# Patient Record
Sex: Male | Born: 1965 | Race: White | Hispanic: No | Marital: Married | Smoking: Never smoker
Health system: Southern US, Community
[De-identification: ages and names within clinical notes are randomized; demographics above are authoritative.]

## PROBLEM LIST (undated history)

## (undated) DIAGNOSIS — M419 Scoliosis, unspecified: Secondary | ICD-10-CM

## (undated) DIAGNOSIS — G4700 Insomnia, unspecified: Secondary | ICD-10-CM

## (undated) DIAGNOSIS — I10 Essential (primary) hypertension: Secondary | ICD-10-CM

## (undated) DIAGNOSIS — I639 Cerebral infarction, unspecified: Secondary | ICD-10-CM

## (undated) DIAGNOSIS — J342 Deviated nasal septum: Secondary | ICD-10-CM

## (undated) DIAGNOSIS — J209 Acute bronchitis, unspecified: Secondary | ICD-10-CM

## (undated) DIAGNOSIS — E119 Type 2 diabetes mellitus without complications: Secondary | ICD-10-CM

## (undated) DIAGNOSIS — Z87442 Personal history of urinary calculi: Secondary | ICD-10-CM

## (undated) DIAGNOSIS — J302 Other seasonal allergic rhinitis: Secondary | ICD-10-CM

## (undated) DIAGNOSIS — N200 Calculus of kidney: Secondary | ICD-10-CM

## (undated) DIAGNOSIS — F419 Anxiety disorder, unspecified: Secondary | ICD-10-CM

## (undated) DIAGNOSIS — J343 Hypertrophy of nasal turbinates: Secondary | ICD-10-CM

## (undated) DIAGNOSIS — N400 Enlarged prostate without lower urinary tract symptoms: Secondary | ICD-10-CM

## (undated) DIAGNOSIS — E785 Hyperlipidemia, unspecified: Secondary | ICD-10-CM

---
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
## 1985-08-28 HISTORY — PX: KNEE SURGERY: SHX244

## 2007-04-11 ENCOUNTER — Ambulatory Visit: Payer: Self-pay | Admitting: Internal Medicine

## 2008-03-17 ENCOUNTER — Ambulatory Visit: Payer: Self-pay | Admitting: Internal Medicine

## 2008-07-01 ENCOUNTER — Ambulatory Visit: Payer: Self-pay | Admitting: Family Medicine

## 2008-07-10 ENCOUNTER — Ambulatory Visit: Payer: Self-pay | Admitting: Internal Medicine

## 2009-09-20 ENCOUNTER — Ambulatory Visit: Payer: Self-pay | Admitting: Internal Medicine

## 2010-06-27 ENCOUNTER — Emergency Department: Payer: Self-pay | Admitting: Emergency Medicine

## 2010-07-11 ENCOUNTER — Ambulatory Visit: Payer: Self-pay | Admitting: Urology

## 2013-10-11 ENCOUNTER — Ambulatory Visit: Payer: Self-pay | Admitting: Emergency Medicine

## 2014-05-06 ENCOUNTER — Emergency Department: Payer: Self-pay | Admitting: Emergency Medicine

## 2015-08-08 ENCOUNTER — Ambulatory Visit
Admission: EM | Admit: 2015-08-08 | Discharge: 2015-08-08 | Disposition: A | Discharge status: Home / Self Care | Payer: BC Managed Care – PPO | Attending: Family Medicine | Admitting: Family Medicine

## 2015-08-08 ENCOUNTER — Encounter: Payer: Self-pay | Admitting: Gynecology

## 2015-08-08 DIAGNOSIS — J209 Acute bronchitis, unspecified: Secondary | ICD-10-CM

## 2015-08-08 DIAGNOSIS — R05 Cough: Secondary | ICD-10-CM

## 2015-08-08 DIAGNOSIS — J40 Bronchitis, not specified as acute or chronic: Secondary | ICD-10-CM | POA: Diagnosis not present

## 2015-08-08 DIAGNOSIS — R059 Cough, unspecified: Secondary | ICD-10-CM

## 2015-08-08 HISTORY — DX: Type 2 diabetes mellitus without complications: E11.9

## 2015-08-08 HISTORY — DX: Scoliosis, unspecified: M41.9

## 2015-08-08 HISTORY — DX: Acute bronchitis, unspecified: J20.9

## 2015-08-08 HISTORY — DX: Calculus of kidney: N20.0

## 2015-08-08 HISTORY — DX: Essential (primary) hypertension: I10

## 2015-08-08 HISTORY — DX: Hyperlipidemia, unspecified: E78.5

## 2015-08-08 HISTORY — DX: Anxiety disorder, unspecified: F41.9

## 2015-08-08 HISTORY — DX: Insomnia, unspecified: G47.00

## 2015-08-08 MED ORDER — FEXOFENADINE-PSEUDOEPHED ER 180-240 MG PO TB24: 1.0000 | ORAL_TABLET | Freq: Every day | ORAL | Status: DC

## 2015-08-08 MED ORDER — HYDROCOD POLST-CPM POLST ER 10-8 MG/5ML PO SUER: 5.0000 mL | Freq: Two times a day (BID) | ORAL | Status: DC | PRN

## 2015-08-08 MED ORDER — AZITHROMYCIN 500 MG PO TABS: ORAL_TABLET | ORAL | Status: DC

## 2015-08-08 NOTE — ED Provider Notes (Signed)
CSN: [id number]     Arrival date & time 08/08/15  0807 History   First MD Initiated Contact with Patient 08/08/15 [phone]    Nurses notes were reviewed. Chief Complaint  Patient presents with  . Cough   patient is here because of cough for about 3 weeks or just about Thanksgiving time. States that he had bronchopneumonia about 2 years ago started off just like this. He has been coughing and coughing up some grayish thick material at times. Denies having any wheezing as she states when he goes cold weather the help with cough but the minute he gets inside the house. Coughing. States she's using humidifier but is has not helped. She saw his doctor last week early for clearance for upcoming surgery and mentioned it to him. He was put on decongestant that has not helped. Try to get him see his doctor Thursday was unable to get an appointment and was unable to come to urgent care on Friday or Saturday. He is here today because of the persistent coughing and keeping him up at night     (Consider location/radiation/quality/duration/timing/severity/associated sxs/prior Treatment) Patient is a 49 y.o. male presenting with cough. The history is provided by the patient. No language interpreter was used.  Cough Cough characteristics:  Productive and non-productive Sputum characteristics:  Brown and yellow Severity:  Moderate Timing:  Constant Progression:  Worsening Context: upper respiratory infection and with activity   Context: not animal exposure, not exposure to allergens, not fumes, not occupational exposure, not sick contacts, not smoke exposure and not weather changes   Relieved by:  Nothing Ineffective treatments:  Steam, rest and cough suppressants Associated symptoms: rhinorrhea and sinus congestion   Associated symptoms: no chest pain, no chills, no diaphoresis, no ear fullness, no ear pain, no fever, no myalgias, no shortness of breath, no sore throat, no weight loss and no wheezing     Past  Medical History  Diagnosis Date  . Diabetes mellitus without complication (HCC)   . Hypertension   . Anxiety   . Scoliosis   . Hyperlipemia   . Insomnia   . Nephrolithiasis    Past Surgical History  Procedure Laterality Date  . Hernia repair    . Knee surgery Right    No family history on file. Social History  Substance Use Topics  . Smoking status: Never Smoker   . Smokeless tobacco: None  . Alcohol Use: Yes    Review of Systems  Constitutional: Negative for fever, chills, weight loss and diaphoresis.  HENT: Positive for rhinorrhea. Negative for ear pain and sore throat.   Respiratory: Positive for cough. Negative for shortness of breath and wheezing.   Cardiovascular: Negative for chest pain.  Musculoskeletal: Negative for myalgias.  All other systems reviewed and are negative.   Allergies  Review of patient's allergies indicates no known allergies.  Home Medications   Prior to Admission medications   Medication Sig Start Date End Date Taking? Authorizing Provider  ALPRAZolam Prudy Feeler(XANAX) 0.5 MG tablet Take 0.5 mg by mouth at bedtime as needed for anxiety.   Yes Historical Provider, MD  aspirin 81 MG tablet Take 81 mg by mouth daily.   Yes Historical Provider, MD  fluticasone (FLONASE) 50 MCG/ACT nasal spray Place into both nostrils daily.   Yes Historical Provider, MD  losartan (COZAAR) 100 MG tablet Take 100 mg by mouth daily.   Yes Historical Provider, MD  Melatonin 1 MG CAPS Take by mouth.   Yes Historical Provider, MD  metFORMIN (GLUCOPHAGE) 500 MG tablet Take by mouth 2 (two) times daily with a meal.   Yes Historical Provider, MD  metoprolol (LOPRESSOR) 50 MG tablet Take 50 mg by mouth 2 (two) times daily.   Yes Historical Provider, MD  montelukast (SINGULAIR) 10 MG tablet Take 10 mg by mouth at bedtime.   Yes Historical Provider, MD  rosuvastatin (CRESTOR) 20 MG tablet Take 20 mg by mouth daily.   Yes Historical Provider, MD  traZODone (DESYREL) 100 MG tablet Take  100 mg by mouth at bedtime.   Yes Historical Provider, MD  azithromycin (ZITHROMAX) 500 MG tablet Take 1 po daily for five days 08/08/15   Hassan Rowan, MD  chlorpheniramine-HYDROcodone [facility]) 10-8 MG/5ML SUER Take 5 mLs by mouth every 12 (twelve) hours as needed for cough. 08/08/15   Hassan Rowan, MD  fexofenadine-pseudoephedrine (ALLEGRA-D ALLERGY & CONGESTION) 180-240 MG 24 hr tablet Take 1 tablet by mouth daily. 08/08/15   Hassan Rowan, MD   Meds Ordered and Administered this Visit  Medications - No data to display  BP 114/73 mmHg  Pulse 76  Temp(Src) 98 F (36.7 C) (Oral)  Resp 18  Ht  (1.803 m)  Wt 235 lb (106.595 kg)  BMI 32.79 kg/m2  SpO2 98% No data found.   Physical Exam  Constitutional: He is oriented to person, place, and time. He appears well-developed and well-nourished.  HENT:  Head: Normocephalic.  Eyes: Conjunctivae are normal. Pupils are equal, round, and reactive to light.  Neck: Normal range of motion. Neck supple. No tracheal deviation present. No thyromegaly present.  Cardiovascular: Normal rate and regular rhythm.   Pulmonary/Chest: Effort normal and breath sounds normal. No respiratory distress.  Musculoskeletal: Normal range of motion. He exhibits no edema.  Neurological: He is alert and oriented to person, place, and time.  Skin: Skin is warm and dry.  Psychiatric: He has a normal mood and affect.  Vitals reviewed.   ED Course  Procedures (including critical care time)  Labs Review Labs Reviewed - No data to display  Imaging Review No results found.   Visual Acuity Review  Right Eye Distance:   Left Eye Distance:   Bilateral Distance:    Right Eye Near:   Left Eye Near:    Bilateral Near:         MDM   1. Bronchitis   2. Cough    Place him on Zithromax 500 mg for the next 5 days, Tussionex 1 teaspoon twice a day, Allegra-D 1 tablet daily. 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  Have him follow-up PCP if not better in 1-2 weeks Dr. Thurmond Butts dictation thank you.     Hassan Rowan, MD 08/08/15 [phone]

## 2015-08-08 NOTE — ED Notes (Signed)
Patient c/o persistence coughing x 3 weeks.

## 2015-08-08 NOTE — Discharge Instructions (Signed)
Cool Mist Vaporizers °Vaporizers may help relieve the symptoms of a cough and cold. They add moisture to the air, which helps mucus to become thinner and less sticky. This makes it easier to breathe and cough up secretions. Cool mist vaporizers do not cause serious burns like hot mist vaporizers, which may also be called steamers or humidifiers. Vaporizers have not been proven to help with colds. You should not use a vaporizer if you are allergic to mold. °HOME CARE INSTRUCTIONS °· Follow the package instructions for the vaporizer. °· Do not use anything other than distilled water in the vaporizer. °· Do not run the vaporizer all of the time. This can cause mold or bacteria to grow in the vaporizer. °· Clean the vaporizer after each time it is used. °· Clean and dry the vaporizer well before storing it. °· Stop using the vaporizer if worsening respiratory symptoms develop. °  °This information is not intended to replace advice given to you by your health care provider. Make sure you discuss any questions you have with your health care provider. °  °Document Released: 05/11/2004 Document Revised: 08/19/2013 Document Reviewed: 01/01/2013 °Elsevier Interactive Patient Education ©2016 [employer] ° °Cough, Adult °A cough helps to clear your throat and lungs. A cough may last only 2-3 weeks (acute), or it may last longer than 8 weeks (chronic). Many different things can cause a cough. A cough may be a sign of an illness or another medical condition. °HOME CARE °· Pay attention to any changes in your cough. °· Take medicines only as told by your doctor. °¨ If you were prescribed an antibiotic medicine, take it as told by your doctor. Do not stop taking it even if you start to feel better. °¨ Talk with your doctor before you try using a cough medicine. °· Drink enough fluid to keep your pee (urine) clear or pale yellow. °· If the air is dry, use a cold steam vaporizer or humidifier in your home. °· Stay away from things  that make you cough at work or at home. °· If your cough is worse at night, try using extra pillows to raise your head up higher while you sleep. °· Do not smoke, and try not to be around smoke. If you need help quitting, ask your doctor. °· Do not have caffeine. °· Do not drink alcohol. °· Rest as needed. °GET HELP IF: °· You have new problems (symptoms). °· You cough up yellow fluid (pus). °· Your cough does not get better after 2-3 weeks, or your cough gets worse. °· Medicine does not help your cough and you are not sleeping well. °· You have pain that gets worse or pain that is not helped with medicine. °· You have a fever. °· You are losing weight and you do not know why. °· You have night sweats. °GET HELP RIGHT AWAY IF: °· You cough up blood. °· You have trouble breathing. °· Your heartbeat is very fast. °  °This information is not intended to replace advice given to you by your health care provider. Make sure you discuss any questions you have with your health care provider. °  °Document Released: 04/27/2011 Document Revised: 05/05/2015 Document Reviewed: 10/21/2014 °Elsevier Interactive Patient Education ©2016 [employer] ° °Upper Respiratory Infection, Adult °Most upper respiratory infections (URIs) are caused by a virus. A URI affects the nose, throat, and upper air passages. The most common type of URI is often called "the common cold." °HOME CARE  °·   Take medicines only as told by your doctor. °· Gargle warm saltwater or take cough drops to comfort your throat as told by your doctor. °· Use a warm mist humidifier or inhale steam from a shower to increase air moisture. This may make it easier to breathe. °· Drink enough fluid to keep your pee (urine) clear or pale yellow. °· Eat soups and other clear broths. °· Have a healthy diet. °· Rest as needed. °· Go back to work when your fever is gone or your doctor says it is okay. °¨ You may need to stay home longer to avoid giving your URI to others. °¨ You  can also wear a face mask and wash your hands often to prevent spread of the virus. °· Use your inhaler more if you have asthma. °· Do not use any tobacco products, including cigarettes, chewing tobacco, or electronic cigarettes. If you need help quitting, ask your doctor. °GET HELP IF: °· You are getting worse, not better. °· Your symptoms are not helped by medicine. °· You have chills. °· You are getting more short of breath. °· You have brown or red mucus. °· You have yellow or brown discharge from your nose. °· You have pain in your face, especially when you bend forward. °· You have a fever. °· You have puffy (swollen) neck glands. °· You have pain while swallowing. °· You have white areas in the back of your throat. °GET HELP RIGHT AWAY IF:  °· You have very bad or constant: °¨ Headache. °¨ Ear pain. °¨ Pain in your forehead, behind your eyes, and over your cheekbones (sinus pain). °¨ Chest pain. °· You have long-lasting (chronic) lung disease and any of the following: °¨ Wheezing. °¨ Long-lasting cough. °¨ Coughing up blood. °¨ A change in your usual mucus. °· You have a stiff neck. °· You have changes in your: °¨ Vision. °¨ Hearing. °¨ Thinking. °¨ Mood. °MAKE SURE YOU:  °· Understand these instructions. °· Will watch your condition. °· Will get help right away if you are not doing well or get worse. °  °This information is not intended to replace advice given to you by your health care provider. Make sure you discuss any questions you have with your health care provider. °  °Document Released: 01/31/2008 Document Revised: 12/29/2014 Document Reviewed: 11/19/2013 °Elsevier Interactive Patient Education ©2016 [employer] Inc. ° °

## 2015-08-18 ENCOUNTER — Encounter: Payer: Self-pay | Admitting: *Deleted

## 2015-08-25 NOTE — Discharge Instructions (Signed)
[facility] °[facility] °ENDOSCOPIC SINUS SURGERY °[facility] ° °What is Functional Endoscopic Sinus Surgery? ° The Surgery involves making the natural openings of the sinuses larger by removing the bony partitions that separate the sinuses from the nasal cavity.  The natural sinus lining is preserved as much as possible to allow the sinuses to resume normal function after the surgery.  In some patients nasal polyps (excessively swollen lining of the sinuses) may be removed to relieve obstruction of the sinus openings.  The surgery is performed through the nose using lighted scopes, which eliminates the need for incisions on the face.  A septoplasty is a different procedure which is sometimes performed with sinus surgery.  It involves straightening the boy partition that separates the two sides of your nose.  A crooked or deviated septum may need repair if is obstructing the sinuses or nasal airflow.  Turbinate reduction is also often performed during sinus surgery.  The turbinates are bony proturberances from the side walls of the nose which swell and can obstruct the nose in patients with sinus and allergy problems.  Their size can be surgically reduced to help relieve nasal obstruction. ° °What Can Sinus Surgery Do For Me? ° Sinus surgery can reduce the frequency of sinus infections requiring antibiotic treatment.  This can provide improvement in nasal congestion, post-nasal drainage, facial pressure and nasal obstruction.  Surgery will NOT prevent you from ever having an infection again, so it usually only for patients who get infections 4 or more times yearly requiring antibiotics, or for infections that do not clear with antibiotics.  It will not cure nasal allergies, so patients with allergies may still require medication to treat their allergies after surgery. Surgery may improve headaches related to sinusitis, however, some people will continue to  require medication to control sinus headaches related to allergies.  Surgery will do nothing for other forms of headache (migraine, tension or cluster). ° °What Are the Risks of Endoscopic Sinus Surgery? ° Current techniques allow surgery to be performed safely with little risk, however, there are rare complications that patients should be aware of.  Because the sinuses are located around the eyes, there is risk of eye injury, including blindness, though again, this would be quite rare. This is usually a result of bleeding behind the eye during surgery, which puts the vision oat risk, though there are treatments to protect the vision and prevent permanent disrupted by surgery causing a leak of the spinal fluid that surrounds the brain.  More serious complications would include bleeding inside the brain cavity or damage to the brain.  Again, all of these complications are uncommon, and spinal fluid leaks can be safely managed surgically if they occur.  The most common complication of sinus surgery is bleeding from the nose, which may require packing or cauterization of the nose.  Continued sinus have polyps may experience recurrence of the polyps requiring revision surgery.  Alterations of sense of smell or injury to the tear ducts are also rare complications.  ° °What is the Surgery Like, and what is the Recovery? ° The Surgery usually takes a couple of hours to perform, and is usually performed under a general anesthetic (completely asleep).  Patients are usually discharged home after a couple of hours.  Sometimes during surgery it is necessary to pack the nose to control bleeding, and the packing is left in place for 24 - 48 hours, and removed by your surgeon.    If a septoplasty was performed during the procedure, there is often a splint placed which must be removed after 5-7 days.   °Discomfort: Pain is usually mild to moderate, and can be controlled by prescription pain medication or acetaminophen (Tylenol).   Aspirin, Ibuprofen (Advil, Motrin), or Naprosyn (Aleve) should be avoided, as they can cause increased bleeding.  Most patients feel sinus pressure like they have a bad head cold for several days.  Sleeping with your head elevated can help reduce swelling and facial pressure, as can ice packs over the face.  A humidifier may be helpful to keep the mucous and blood from drying in the nose.  ° °Diet: There are no specific diet restrictions, however, you should generally start with clear liquids and a light diet of bland foods because the anesthetic can cause some nausea.  Advance your diet depending on how your stomach feels.  Taking your pain medication with food will often help reduce stomach upset which pain medications can cause. ° °Nasal Saline Irrigation: It is important to remove blood clots and dried mucous from the nose as it is healing.  This is done by having you irrigate the nose at least 3 - 4 times daily with a salt water solution.  We recommend using NeilMed Sinus Rinse (available at the drug store).  Fill the squeeze bottle with the solution, bend over a sink, and insert the tip of the squeeze bottle into the nose ½ of an inch.  Point the tip of the squeeze bottle towards the inside corner of the eye on the same side your irrigating.  Squeeze the bottle and gently irrigate the nose.  If you bend forward as you do this, most of the fluid will flow back out of the nose, instead of down your throat.   The solution should be warm, near body temperature, when you irrigate.   Each time you irrigate, you should use a full squeeze bottle.  ° °Note that if you are instructed to use Nasal Steroid Sprays at any time after your surgery, irrigate with saline BEFORE using the steroid spray, so you do not wash it all out of the nose. °Another product, Nasal Saline Gel (such as AYR Nasal Saline Gel) can be applied in each nostril 3 - 4 times daily to moisture the nose and reduce scabbing or crusting. ° °Bleeding:   Bloody drainage from the nose can be expected for several days, and patients are instructed to irrigate their nose frequently with salt water to help remove mucous and blood clots.  The drainage may be dark red or brown, though some fresh blood may be seen intermittently, especially after irrigation.  Do not blow you nose, as bleeding may occur. If you must sneeze, keep your mouth open to allow air to escape through your mouth. ° °If heavy bleeding occurs: Irrigate the nose with saline to rinse out clots, then spray the nose 3 - 4 times with Afrin Nasal Decongestant Spray.  The spray will constrict the blood vessels to slow bleeding.  Pinch the lower half of your nose shut to apply pressure, and lay down with your head elevated.  Ice packs over the nose may help as well. If bleeding persists despite these measures, you should notify your doctor.  Do not use the Afrin routinely to control nasal congestion after surgery, as it can result in worsening congestion and may affect healing.  ° ° ° °Activity: Return to work varies among patients. Most patients will be   out of work at least 5 - 7 days to recover.  Patient may return to work after they are off of narcotic pain medication, and feeling well enough to perform the functions of their job.  Patients must avoid heavy lifting (over 10 pounds) or strenuous physical for 2 weeks after surgery, so your employer may need to assign you to light duty, or keep you out of work longer if light duty is not possible.  NOTE: you should not drive, operate dangerous machinery, do any mentally demanding tasks or make any important legal or financial decisions while on narcotic pain medication and recovering from the general anesthetic.  °  °Call Your Doctor Immediately if You Have Any of the Following: °1. Bleeding that you cannot control with the above measures °2. Loss of vision, double vision, bulging of the eye or black eyes. °3. Fever over 101 degrees °4. Neck stiffness with  severe headache, fever, nausea and change in mental state. °You are always encourage to call anytime with concerns, however, please call with requests for pain medication refills during office hours. ° °Office Endoscopy: During follow-up visits your doctor will remove any packing or splints that may have been placed and evaluate and clean your sinuses endoscopically.  Topical anesthetic will be used to make this as comfortable as possible, though you may want to take your pain medication prior to the visit.  How often this will need to be done varies from patient to patient.  After complete recovery from the surgery, you may need follow-up endoscopy from time to time, particularly if there is concern of recurrent infection or nasal polyps. ° °General Anesthesia, Adult, Care After °Refer to this sheet in the next few weeks. These instructions provide you with information on caring for yourself after your procedure. Your health care provider may also give you more specific instructions. Your treatment has been planned according to current medical practices, but problems sometimes occur. Call your health care provider if you have any problems or questions after your procedure. °WHAT TO EXPECT AFTER THE PROCEDURE °After the procedure, it is typical to experience: °· Sleepiness. °· Nausea and vomiting. °HOME CARE INSTRUCTIONS °· For the first 24 hours after general anesthesia: °¨ Have a responsible person with you. °¨ Do not drive a car. If you are alone, do not take public transportation. °¨ Do not drink alcohol. °¨ Do not take medicine that has not been prescribed by your health care provider. °¨ Do not sign important papers or make important decisions. °¨ You may resume a normal diet and activities as directed by your health care provider. °· Change bandages (dressings) as directed. °· If you have questions or problems that seem related to general anesthesia, call the hospital and ask for the anesthetist or  anesthesiologist on call. °SEEK MEDICAL CARE IF: °· You have nausea and vomiting that continue the day after anesthesia. °· You develop a rash. °SEEK IMMEDIATE MEDICAL CARE IF:  °· You have difficulty breathing. °· You have chest pain. °· You have any allergic problems. °  °This information is not intended to replace advice given to you by your health care provider. Make sure you discuss any questions you have with your health care provider. °  °Document Released: 11/20/2000 Document Revised: 09/04/2014 Document Reviewed: 12/13/2011 °Elsevier Interactive Patient Education ©2016 [employer]vier Inc. ° °

## 2015-08-26 ENCOUNTER — Ambulatory Visit: Payer: BC Managed Care – PPO | Admitting: Student in an Organized Health Care Education/Training Program

## 2015-08-26 ENCOUNTER — Encounter: Payer: Self-pay | Admitting: *Deleted

## 2015-08-26 ENCOUNTER — Ambulatory Visit
Admission: RE | Admit: 2015-08-26 | Discharge: 2015-08-26 | Disposition: A | Discharge status: Home / Self Care | Payer: BC Managed Care – PPO | Source: Ambulatory Visit | Attending: Otolaryngology | Admitting: Otolaryngology

## 2015-08-26 ENCOUNTER — Encounter
Admission: RE | Disposition: A | Discharge status: Home / Self Care | Payer: Self-pay | Source: Ambulatory Visit | Attending: Otolaryngology

## 2015-08-26 DIAGNOSIS — J342 Deviated nasal septum: Secondary | ICD-10-CM | POA: Insufficient documentation

## 2015-08-26 DIAGNOSIS — I10 Essential (primary) hypertension: Secondary | ICD-10-CM | POA: Insufficient documentation

## 2015-08-26 DIAGNOSIS — E119 Type 2 diabetes mellitus without complications: Secondary | ICD-10-CM | POA: Insufficient documentation

## 2015-08-26 DIAGNOSIS — J3489 Other specified disorders of nose and nasal sinuses: Secondary | ICD-10-CM | POA: Diagnosis not present

## 2015-08-26 DIAGNOSIS — J343 Hypertrophy of nasal turbinates: Secondary | ICD-10-CM | POA: Insufficient documentation

## 2015-08-26 DIAGNOSIS — F419 Anxiety disorder, unspecified: Secondary | ICD-10-CM | POA: Insufficient documentation

## 2015-08-26 HISTORY — DX: Acute bronchitis, unspecified: J20.9

## 2015-08-26 HISTORY — PX: SEPTOPLASTY: SHX2393

## 2015-08-26 HISTORY — PX: NASAL TURBINATE REDUCTION: SHX2072

## 2015-08-26 LAB — GLUCOSE, CAPILLARY
GLUCOSE-CAPILLARY: 155 mg/dL — AB (ref 65–99)
Glucose-Capillary: 156 mg/dL — ABNORMAL HIGH (ref 65–99)

## 2015-08-26 SURGERY — SEPTOPLASTY, NOSE
Anesthesia: General | Wound class: Clean Contaminated

## 2015-08-26 MED ORDER — LIDOCAINE-EPINEPHRINE 1 %-1:100000 IJ SOLN
INTRAMUSCULAR | Status: DC | PRN
  Administered 2015-08-26: 7 mL

## 2015-08-26 MED ORDER — PROMETHAZINE HCL 25 MG/ML IJ SOLN: 6.2500 mg | INTRAMUSCULAR | Status: DC | PRN

## 2015-08-26 MED ORDER — MIDAZOLAM HCL 5 MG/5ML IJ SOLN
INTRAMUSCULAR | Status: DC | PRN
  Administered 2015-08-26: 2 mg via INTRAVENOUS

## 2015-08-26 MED ORDER — ACETAMINOPHEN 10 MG/ML IV SOLN
INTRAVENOUS | Status: DC | PRN
  Administered 2015-08-26: 1000 mg via INTRAVENOUS

## 2015-08-26 MED ORDER — SUCCINYLCHOLINE CHLORIDE 20 MG/ML IJ SOLN
INTRAMUSCULAR | Status: DC | PRN
  Administered 2015-08-26: 100 mg via INTRAVENOUS

## 2015-08-26 MED ORDER — PROPOFOL 10 MG/ML IV BOLUS
INTRAVENOUS | Status: DC | PRN
  Administered 2015-08-26: 200 mg via INTRAVENOUS

## 2015-08-26 MED ORDER — OXYMETAZOLINE HCL 0.05 % NA SOLN
2.0000 | Freq: Once | NASAL | Status: AC
  Administered 2015-08-26: 2 via NASAL

## 2015-08-26 MED ORDER — LACTATED RINGERS IV SOLN
INTRAVENOUS | Status: DC
  Administered 2015-08-26: 10:00:00 via INTRAVENOUS

## 2015-08-26 MED ORDER — HYDROMORPHONE HCL 1 MG/ML IJ SOLN: 0.2500 mg | INTRAMUSCULAR | Status: DC | PRN

## 2015-08-26 MED ORDER — PHENYLEPHRINE HCL 0.5 % NA SOLN
NASAL | Status: DC | PRN
  Administered 2015-08-26: 30 mL via TOPICAL

## 2015-08-26 MED ORDER — CEFAZOLIN SODIUM-DEXTROSE 2-3 GM-% IV SOLR
2.0000 g | Freq: Once | INTRAVENOUS | Status: AC
  Administered 2015-08-26: 2 g via INTRAVENOUS

## 2015-08-26 MED ORDER — ROCURONIUM BROMIDE 100 MG/10ML IV SOLN
INTRAVENOUS | Status: DC | PRN
  Administered 2015-08-26: 20 mg via INTRAVENOUS

## 2015-08-26 MED ORDER — MEPERIDINE HCL 25 MG/ML IJ SOLN: 6.2500 mg | INTRAMUSCULAR | Status: DC | PRN

## 2015-08-26 MED ORDER — OXYCODONE HCL 5 MG PO TABS
5.0000 mg | ORAL_TABLET | Freq: Once | ORAL | Status: AC | PRN
  Administered 2015-08-26: 5 mg via ORAL

## 2015-08-26 MED ORDER — ONDANSETRON HCL 4 MG/2ML IJ SOLN
INTRAMUSCULAR | Status: DC | PRN
  Administered 2015-08-26: 4 mg via INTRAVENOUS

## 2015-08-26 MED ORDER — LIDOCAINE HCL (CARDIAC) 20 MG/ML IV SOLN
INTRAVENOUS | Status: DC | PRN
  Administered 2015-08-26: 40 mg via INTRAVENOUS

## 2015-08-26 MED ORDER — FENTANYL CITRATE (PF) 100 MCG/2ML IJ SOLN
INTRAMUSCULAR | Status: DC | PRN
  Administered 2015-08-26: 100 ug via INTRAVENOUS
  Administered 2015-08-26: 25 ug via INTRAVENOUS

## 2015-08-26 MED ORDER — DEXAMETHASONE SODIUM PHOSPHATE 4 MG/ML IJ SOLN
INTRAMUSCULAR | Status: DC | PRN
  Administered 2015-08-26: 10 mg via INTRAVENOUS

## 2015-08-26 MED ORDER — OXYCODONE HCL 5 MG/5ML PO SOLN: 5.0000 mg | Freq: Once | ORAL | Status: AC | PRN

## 2015-08-26 SURGICAL SUPPLY — 43 items
BLADE SURG 15 STRL LF DISP TIS (BLADE) IMPLANT
BLADE SURG 15 STRL SS (BLADE)
CANISTER SUCT 1200ML W/VALVE (MISCELLANEOUS) ×3 IMPLANT
CATH IV 18X1 1/4 SAFELET (CATHETERS) ×3 IMPLANT
COAG SUCT 10F 3.5MM HAND CTRL (MISCELLANEOUS) ×3 IMPLANT
COAGULATOR SUCT 8FR VV (MISCELLANEOUS) IMPLANT
DEVICE INFLATION 20/61 (MISCELLANEOUS) IMPLANT
DRAPE HEAD BAR (DRAPES) ×3 IMPLANT
DRESSING NASL FOAM PST OP SINU (MISCELLANEOUS) IMPLANT
DRSG NASAL 4CM NASOPORE (MISCELLANEOUS) IMPLANT
DRSG NASAL FOAM POST OP SINU (MISCELLANEOUS)
GLOVE PI ULTRA LF STRL 7.5 (GLOVE) ×4 IMPLANT
GLOVE PI ULTRA NON LATEX 7.5 (GLOVE) ×2
IRRIGATOR 4MM STR (IRRIGATION / IRRIGATOR) IMPLANT
IV CATH 18X1 1/4 SAFELET (CATHETERS) ×2
IV NS 500ML (IV SOLUTION) ×1
IV NS 500ML BAXH (IV SOLUTION) ×2 IMPLANT
KIT ROOM TURNOVER OR (KITS) ×3 IMPLANT
NEEDLE HYPO 25GX1X1/2 BEV (NEEDLE) ×3 IMPLANT
NEEDLE SPNL 25GX3.5 QUINCKE BL (NEEDLE) IMPLANT
NS IRRIG 500ML POUR BTL (IV SOLUTION) ×3 IMPLANT
PACK DRAPE NASAL/ENT (PACKS) ×3 IMPLANT
PACKING NASAL EPIS 4X2.4 XEROG (MISCELLANEOUS) IMPLANT
PAD GROUND ADULT SPLIT (MISCELLANEOUS) ×3 IMPLANT
PATTIES SURGICAL .5 X3 (DISPOSABLE) ×3 IMPLANT
SET HANDPIECE IRR DIEGO (MISCELLANEOUS) IMPLANT
SINUPLASTY BALLN CATHTIP (CATHETERS) IMPLANT
SOL ANTI-FOG 6CC FOG-OUT (MISCELLANEOUS) ×2 IMPLANT
SOL FOG-OUT ANTI-FOG 6CC (MISCELLANEOUS) ×1
SPLINT NASAL SEPTAL BLV .50 ST (MISCELLANEOUS) ×3 IMPLANT
STRAP BODY AND KNEE 60X3 (MISCELLANEOUS) ×3 IMPLANT
SUT CHROMIC 3-0 (SUTURE) ×1
SUT CHROMIC 3-0 KS 27XMFL CR (SUTURE) ×2
SUT ETHILON 3-0 KS 30 BLK (SUTURE) ×3 IMPLANT
SUT ETHILON 4-0 (SUTURE)
SUT ETHILON 4-0 FS2 18XMFL BLK (SUTURE)
SUT PLAIN GUT 4-0 (SUTURE) ×3 IMPLANT
SUTURE CHRMC 3-0 KS 27XMFL CR (SUTURE) ×2 IMPLANT
SUTURE ETHLN 4-0 FS2 18XMF BLK (SUTURE) IMPLANT
SYR 3ML LL SCALE MARK (SYRINGE) ×3 IMPLANT
SYSTEM BALLN SINUPLASTY 6X16 (BALLOONS) IMPLANT
TOWEL OR 17X26 4PK STRL BLUE (TOWEL DISPOSABLE) ×3 IMPLANT
WATER STERILE IRR 500ML POUR (IV SOLUTION) IMPLANT

## 2015-08-26 NOTE — Anesthesia Postprocedure Evaluation (Signed)
Anesthesia Post Note  Patient: Keith Reilly  Procedure(s) Performed: Procedure(s) (LRB): SEPTOPLASTY (N/A) LEFT PARTIAL TURBINATE REDUCTION/SUBMUCOSAL RESECTION (Left)  Patient location during evaluation: PACU Anesthesia Type: General Level of consciousness: awake and alert and oriented Pain management: pain level controlled Vital Signs Assessment: post-procedure vital signs reviewed and stable Respiratory status: spontaneous breathing and nonlabored ventilation Cardiovascular status: stable Postop Assessment: no signs of nausea or vomiting and adequate PO intake Anesthetic complications: no    Harolyn RutherfordJoshua Rontae Inglett

## 2015-08-26 NOTE — Anesthesia Procedure Notes (Signed)
Procedure Name: Intubation Date/Time: 08/26/2015 11:01 AM Performed by: Andee PolesBUSH, Yina Riviere Pre-anesthesia Checklist: Patient identified, Emergency Drugs available, Suction available, Patient being monitored and Timeout performed Patient Re-evaluated:Patient Re-evaluated prior to inductionOxygen Delivery Method: Circle system utilized Preoxygenation: Pre-oxygenation with 100% oxygen Intubation Type: IV induction Ventilation: Mask ventilation without difficulty and Oral airway inserted - appropriate to patient size Laryngoscope Size: Mac and 3 Grade View: Grade III Tube type: Oral Rae Tube size: 7.5 mm Number of attempts: 1 Airway Equipment and Method: Bougie stylet,  Video-laryngoscopy and Stylet Placement Confirmation: ETT inserted through vocal cords under direct vision,  positive ETCO2 and breath sounds checked- equal and bilateral Tube secured with: Tape Dental Injury: Teeth and Oropharynx as per pre-operative assessment  Difficulty Due To: Difficulty was unanticipated, Difficult Airway- due to anterior larynx and Difficult Airway- due to limited oral opening Comments: Dl x2 attempted with bougie unsuccessful. Good mask ventilation with oral airway size 9. Dl with glidescope successful after one attempt etco bbs.

## 2015-08-26 NOTE — Transfer of Care (Signed)
Immediate Anesthesia Transfer of Care Note  Patient: Keith Reilly  Procedure(s) Performed: Procedure(s) with comments: SEPTOPLASTY (N/A) LEFT PARTIAL TURBINATE REDUCTION/SUBMUCOSAL RESECTION (Left) - diabetic - oral meds  Patient Location: PACU  Anesthesia Type: General  Level of Consciousness: awake, alert  and patient cooperative  Airway and Oxygen Therapy: Patient Spontanous Breathing and Patient connected to supplemental oxygen  Post-op Assessment: Post-op Vital signs reviewed, Patient's Cardiovascular Status Stable, Respiratory Function Stable, Patent Airway and No signs of Nausea or vomiting  Post-op Vital Signs: Reviewed and stable  Complications: No apparent anesthesia complications

## 2015-08-26 NOTE — Anesthesia Preprocedure Evaluation (Signed)
Anesthesia Evaluation  Patient identified by MRN, date of birth, ID band Patient awake    Reviewed: Allergy & Precautions, NPO status , Patient's Chart, lab work & pertinent test results, reviewed documented beta blocker date and time   Airway Mallampati: II  TM Distance: >3 FB Neck ROM: Full    Dental no notable dental hx.    Pulmonary neg pulmonary ROS,    Pulmonary exam normal        Cardiovascular hypertension, Pt. on medications Normal cardiovascular exam     Neuro/Psych Anxiety negative neurological ROS     GI/Hepatic negative GI ROS, Neg liver ROS,   Endo/Other  diabetes, Type 2, Oral Hypoglycemic Agents  Renal/GU negative Renal ROS     Musculoskeletal   Abdominal   Peds  Hematology negative hematology ROS (+)   Anesthesia Other Findings   Reproductive/Obstetrics                             Anesthesia Physical Anesthesia Plan  ASA: II  Anesthesia Plan: General   Post-op Pain Management:    Induction: Intravenous  Airway Management Planned: Oral ETT  Additional Equipment:   Intra-op Plan:   Post-operative Plan:   Informed Consent: I have reviewed the patients History and Physical, chart, labs and discussed the procedure including the risks, benefits and alternatives for the proposed anesthesia with the patient or authorized representative who has indicated his/her understanding and acceptance.     Plan Discussed with: CRNA  Anesthesia Plan Comments:         Anesthesia Quick Evaluation

## 2015-08-26 NOTE — Op Note (Signed)
08/26/2015  12:04 PM  [id number]   Pre-Op Dx:  Deviated Nasal Septum, Hypertrophic Inferior Turbinates  Post-op Dx: Same  Proc: Nasal Septoplasty, Left Partial Reduction Inferior Turbinate   Surg:  Liev Brockbank H  Anes:  GOT  EBL:   50 mL  Comp:   none  Findings:  Septum buckled towards the right in the lower midportion. Septal spur left side at the max the crest. Ethmoid plate to the left superiorly  Procedure: With the patient in a comfortable supine position,  general orotracheal anesthesia was induced without difficulty.     The patient received preoperative Afrin spray for topical decongestion and vasoconstriction.  Intravenous prophylactic antibiotics were administered.  At an appropriate level, the patient was placed in a semi-sitting position.  Nasal vibrissae were trimmed.   1% Xylocaine with 1:100,000 epinephrine, 7 cc's, was infiltrated into the anterior floor of the nose, into the nasal spine region, into the membranous columella, and finally into the submucoperichondrial plane of the septum on both sides.  Several minutes were allowed for this to take effect.  Cottoniod pledgetts soaked in Afrin and 4% Xylocaine were placed into both nasal cavities and left while the patient was prepped and draped in the standard fashion.  The materials were removed from the nose and observed to be intact and correct in number.  The nose was inspected with a headlight with the findings as described above.  A left Killian incision was sharply executed and carried down to the caudal edge of the quadrangular cartilage. The mucoperichondrium was elelvated along the quadrangular plate back to the bony-cartilaginous junction. The mucoperiostium was then elevated along the ethmoid plate and the vomer. The boney-catilaginous junction was then split with a freer elevator and the mucoperiosteum was elevated on the opposite side. The mucoperiosteum was then elevated along the maxillary crest as needed  to expose the crooked bone of the crest.  Boney spurs of the vomer and maxillary crest were removed with Lenoria Chimeakahashi forceps.  The cartilaginous plate was trimmed along its posterior and inferior borders of about 2 mm of cartilage to free it up inferiorly. Some of the deviated ethmoid plate was then fractured and removed with Takahashi forceps to free up the posterior border of the quadrangular plate and allow it to swing back to the midline. The mucosal flaps were placed back into their anatomic position to allow visualization of the airways. The septum now sat in the midline with an improved airway.  A 3-0 Chromic suture on a Keith needle in used to anchor the inferior septum at the nasal spine with a through and through suture. The mucosal flaps are then sutured together using a through and through whip stitch of 4-0 Plain Gut with a mini-Keith needle.  This was used to close the RolandKillian incision as well.   The inferior turbinates were then inspected. An incision was created along the inferior aspect of the left inferior turbinate with removal of some of the inferior soft tissue and bone. Electrocautery was used to control bleeding in the area. The remaining turbinate was then outfractured to open up the airway further. There was no significant bleeding noted. The  Right inferior turbinate was outfractured.  The airways were then visualized and showed open passageways on both sides that were significantly improved compared to before surgery. There was no signifcant bleeding. Nasal splints were applied to both sides of the septum using Xomed 0.615mm regular sized splints that were trimmed, and then held in position with a  3-0 Nylon through and through suture.  The patient was turned back over to anesthesia, and awakened, extubated, and taken to the PACU in satisfactory condition.  Dispo:   PACU to home  Plan: Ice, elevation, narcotic analgesia, steroid taper, and prophylactic antibiotics for the duration  of indwelling nasal foreign bodies.  We will reevaluate the patient in the office in 6 days and remove the septal splints.  Return to work in 10 days, strenuous activities in two weeks.   Val Farnam H 08/26/2015 12:04 PM

## 2015-08-26 NOTE — H&P (Signed)
  H&P has been reviewed and no changes necessary. To be downloaded later. 

## 2015-08-27 ENCOUNTER — Encounter: Payer: Self-pay | Admitting: Otolaryngology

## 2015-08-29 DIAGNOSIS — I639 Cerebral infarction, unspecified: Secondary | ICD-10-CM

## 2015-08-29 HISTORY — DX: Cerebral infarction, unspecified: I63.9

## 2015-11-30 NOTE — Discharge Instructions (Signed)
[facility] °[facility] °ENDOSCOPIC SINUS SURGERY °[facility] ° °What is Functional Endoscopic Sinus Surgery? ° The Surgery involves making the natural openings of the sinuses larger by removing the bony partitions that separate the sinuses from the nasal cavity.  The natural sinus lining is preserved as much as possible to allow the sinuses to resume normal function after the surgery.  In some patients nasal polyps (excessively swollen lining of the sinuses) may be removed to relieve obstruction of the sinus openings.  The surgery is performed through the nose using lighted scopes, which eliminates the need for incisions on the face.  A septoplasty is a different procedure which is sometimes performed with sinus surgery.  It involves straightening the boy partition that separates the two sides of your nose.  A crooked or deviated septum may need repair if is obstructing the sinuses or nasal airflow.  Turbinate reduction is also often performed during sinus surgery.  The turbinates are bony proturberances from the side walls of the nose which swell and can obstruct the nose in patients with sinus and allergy problems.  Their size can be surgically reduced to help relieve nasal obstruction. ° °What Can Sinus Surgery Do For Me? ° Sinus surgery can reduce the frequency of sinus infections requiring antibiotic treatment.  This can provide improvement in nasal congestion, post-nasal drainage, facial pressure and nasal obstruction.  Surgery will NOT prevent you from ever having an infection again, so it usually only for patients who get infections 4 or more times yearly requiring antibiotics, or for infections that do not clear with antibiotics.  It will not cure nasal allergies, so patients with allergies may still require medication to treat their allergies after surgery. Surgery may improve headaches related to sinusitis, however, some people will continue to  require medication to control sinus headaches related to allergies.  Surgery will do nothing for other forms of headache (migraine, tension or cluster). ° °What Are the Risks of Endoscopic Sinus Surgery? ° Current techniques allow surgery to be performed safely with little risk, however, there are rare complications that patients should be aware of.  Because the sinuses are located around the eyes, there is risk of eye injury, including blindness, though again, this would be quite rare. This is usually a result of bleeding behind the eye during surgery, which puts the vision oat risk, though there are treatments to protect the vision and prevent permanent disrupted by surgery causing a leak of the spinal fluid that surrounds the brain.  More serious complications would include bleeding inside the brain cavity or damage to the brain.  Again, all of these complications are uncommon, and spinal fluid leaks can be safely managed surgically if they occur.  The most common complication of sinus surgery is bleeding from the nose, which may require packing or cauterization of the nose.  Continued sinus have polyps may experience recurrence of the polyps requiring revision surgery.  Alterations of sense of smell or injury to the tear ducts are also rare complications.  ° °What is the Surgery Like, and what is the Recovery? ° The Surgery usually takes a couple of hours to perform, and is usually performed under a general anesthetic (completely asleep).  Patients are usually discharged home after a couple of hours.  Sometimes during surgery it is necessary to pack the nose to control bleeding, and the packing is left in place for 24 - 48 hours, and removed by your surgeon.    If a septoplasty was performed during the procedure, there is often a splint placed which must be removed after 5-7 days.   °Discomfort: Pain is usually mild to moderate, and can be controlled by prescription pain medication or acetaminophen (Tylenol).   Aspirin, Ibuprofen (Advil, Motrin), or Naprosyn (Aleve) should be avoided, as they can cause increased bleeding.  Most patients feel sinus pressure like they have a bad head cold for several days.  Sleeping with your head elevated can help reduce swelling and facial pressure, as can ice packs over the face.  A humidifier may be helpful to keep the mucous and blood from drying in the nose.  ° °Diet: There are no specific diet restrictions, however, you should generally start with clear liquids and a light diet of bland foods because the anesthetic can cause some nausea.  Advance your diet depending on how your stomach feels.  Taking your pain medication with food will often help reduce stomach upset which pain medications can cause. ° °Nasal Saline Irrigation: It is important to remove blood clots and dried mucous from the nose as it is healing.  This is done by having you irrigate the nose at least 3 - 4 times daily with a salt water solution.  We recommend using NeilMed Sinus Rinse (available at the drug store).  Fill the squeeze bottle with the solution, bend over a sink, and insert the tip of the squeeze bottle into the nose ½ of an inch.  Point the tip of the squeeze bottle towards the inside corner of the eye on the same side your irrigating.  Squeeze the bottle and gently irrigate the nose.  If you bend forward as you do this, most of the fluid will flow back out of the nose, instead of down your throat.   The solution should be warm, near body temperature, when you irrigate.   Each time you irrigate, you should use a full squeeze bottle.  ° °Note that if you are instructed to use Nasal Steroid Sprays at any time after your surgery, irrigate with saline BEFORE using the steroid spray, so you do not wash it all out of the nose. °Another product, Nasal Saline Gel (such as AYR Nasal Saline Gel) can be applied in each nostril 3 - 4 times daily to moisture the nose and reduce scabbing or crusting. ° °Bleeding:   Bloody drainage from the nose can be expected for several days, and patients are instructed to irrigate their nose frequently with salt water to help remove mucous and blood clots.  The drainage may be dark red or brown, though some fresh blood may be seen intermittently, especially after irrigation.  Do not blow you nose, as bleeding may occur. If you must sneeze, keep your mouth open to allow air to escape through your mouth. ° °If heavy bleeding occurs: Irrigate the nose with saline to rinse out clots, then spray the nose 3 - 4 times with Afrin Nasal Decongestant Spray.  The spray will constrict the blood vessels to slow bleeding.  Pinch the lower half of your nose shut to apply pressure, and lay down with your head elevated.  Ice packs over the nose may help as well. If bleeding persists despite these measures, you should notify your doctor.  Do not use the Afrin routinely to control nasal congestion after surgery, as it can result in worsening congestion and may affect healing.  ° ° ° °Activity: Return to work varies among patients. Most patients will be   out of work at least 5 - 7 days to recover.  Patient may return to work after they are off of narcotic pain medication, and feeling well enough to perform the functions of their job.  Patients must avoid heavy lifting (over 10 pounds) or strenuous physical for 2 weeks after surgery, so your employer may need to assign you to light duty, or keep you out of work longer if light duty is not possible.  NOTE: you should not drive, operate dangerous machinery, do any mentally demanding tasks or make any important legal or financial decisions while on narcotic pain medication and recovering from the general anesthetic.  °  °Call Your Doctor Immediately if You Have Any of the Following: °1. Bleeding that you cannot control with the above measures °2. Loss of vision, double vision, bulging of the eye or black eyes. °3. Fever over 101 degrees °4. Neck stiffness with  severe headache, fever, nausea and change in mental state. °You are always encourage to call anytime with concerns, however, please call with requests for pain medication refills during office hours. ° °Office Endoscopy: During follow-up visits your doctor will remove any packing or splints that may have been placed and evaluate and clean your sinuses endoscopically.  Topical anesthetic will be used to make this as comfortable as possible, though you may want to take your pain medication prior to the visit.  How often this will need to be done varies from patient to patient.  After complete recovery from the surgery, you may need follow-up endoscopy from time to time, particularly if there is concern of recurrent infection or nasal polyps. ° °General Anesthesia, Adult, Care After °Refer to this sheet in the next few weeks. These instructions provide you with information on caring for yourself after your procedure. Your health care provider may also give you more specific instructions. Your treatment has been planned according to current medical practices, but problems sometimes occur. Call your health care provider if you have any problems or questions after your procedure. °WHAT TO EXPECT AFTER THE PROCEDURE °After the procedure, it is typical to experience: °· Sleepiness. °· Nausea and vomiting. °HOME CARE INSTRUCTIONS °· For the first 24 hours after general anesthesia: °¨ Have a responsible person with you. °¨ Do not drive a car. If you are alone, do not take public transportation. °¨ Do not drink alcohol. °¨ Do not take medicine that has not been prescribed by your health care provider. °¨ Do not sign important papers or make important decisions. °¨ You may resume a normal diet and activities as directed by your health care provider. °· Change bandages (dressings) as directed. °· If you have questions or problems that seem related to general anesthesia, call the hospital and ask for the anesthetist or  anesthesiologist on call. °SEEK MEDICAL CARE IF: °· You have nausea and vomiting that continue the day after anesthesia. °· You develop a rash. °SEEK IMMEDIATE MEDICAL CARE IF:  °· You have difficulty breathing. °· You have chest pain. °· You have any allergic problems. °  °This information is not intended to replace advice given to you by your health care provider. Make sure you discuss any questions you have with your health care provider. °  °Document Released: 11/20/2000 Document Revised: 09/04/2014 Document Reviewed: 12/13/2011 °Elsevier Interactive Patient Education ©2016 [employer] ° °

## 2015-12-02 ENCOUNTER — Encounter: Payer: Self-pay | Admitting: *Deleted

## 2015-12-02 ENCOUNTER — Encounter
Admission: RE | Disposition: A | Discharge status: Home / Self Care | Payer: Self-pay | Source: Ambulatory Visit | Attending: Otolaryngology

## 2015-12-02 ENCOUNTER — Ambulatory Visit: Payer: BC Managed Care – PPO | Admitting: Anesthesiology

## 2015-12-02 ENCOUNTER — Ambulatory Visit
Admission: RE | Admit: 2015-12-02 | Discharge: 2015-12-02 | Disposition: A | Discharge status: Home / Self Care | Payer: BC Managed Care – PPO | Source: Ambulatory Visit | Attending: Otolaryngology | Admitting: Otolaryngology

## 2015-12-02 DIAGNOSIS — E785 Hyperlipidemia, unspecified: Secondary | ICD-10-CM | POA: Insufficient documentation

## 2015-12-02 DIAGNOSIS — Z79899 Other long term (current) drug therapy: Secondary | ICD-10-CM | POA: Insufficient documentation

## 2015-12-02 DIAGNOSIS — N400 Enlarged prostate without lower urinary tract symptoms: Secondary | ICD-10-CM | POA: Diagnosis not present

## 2015-12-02 DIAGNOSIS — E118 Type 2 diabetes mellitus with unspecified complications: Secondary | ICD-10-CM | POA: Insufficient documentation

## 2015-12-02 DIAGNOSIS — Z7984 Long term (current) use of oral hypoglycemic drugs: Secondary | ICD-10-CM | POA: Diagnosis not present

## 2015-12-02 DIAGNOSIS — I10 Essential (primary) hypertension: Secondary | ICD-10-CM | POA: Insufficient documentation

## 2015-12-02 DIAGNOSIS — G4700 Insomnia, unspecified: Secondary | ICD-10-CM | POA: Diagnosis not present

## 2015-12-02 DIAGNOSIS — J3489 Other specified disorders of nose and nasal sinuses: Secondary | ICD-10-CM | POA: Insufficient documentation

## 2015-12-02 DIAGNOSIS — Z87442 Personal history of urinary calculi: Secondary | ICD-10-CM | POA: Diagnosis not present

## 2015-12-02 DIAGNOSIS — E7800 Pure hypercholesterolemia, unspecified: Secondary | ICD-10-CM | POA: Insufficient documentation

## 2015-12-02 DIAGNOSIS — E669 Obesity, unspecified: Secondary | ICD-10-CM | POA: Diagnosis not present

## 2015-12-02 DIAGNOSIS — F419 Anxiety disorder, unspecified: Secondary | ICD-10-CM | POA: Insufficient documentation

## 2015-12-02 DIAGNOSIS — J342 Deviated nasal septum: Secondary | ICD-10-CM | POA: Insufficient documentation

## 2015-12-02 DIAGNOSIS — Z8249 Family history of ischemic heart disease and other diseases of the circulatory system: Secondary | ICD-10-CM | POA: Diagnosis not present

## 2015-12-02 DIAGNOSIS — Z833 Family history of diabetes mellitus: Secondary | ICD-10-CM | POA: Diagnosis not present

## 2015-12-02 HISTORY — DX: Deviated nasal septum: J34.2

## 2015-12-02 HISTORY — DX: Benign prostatic hyperplasia without lower urinary tract symptoms: N40.0

## 2015-12-02 HISTORY — PX: SEPTOPLASTY: SHX2393

## 2015-12-02 HISTORY — DX: Hypertrophy of nasal turbinates: J34.3

## 2015-12-02 LAB — GLUCOSE, CAPILLARY
GLUCOSE-CAPILLARY: 124 mg/dL — AB (ref 65–99)
GLUCOSE-CAPILLARY: 142 mg/dL — AB (ref 65–99)

## 2015-12-02 SURGERY — SEPTOPLASTY, NOSE
Anesthesia: General | Site: Nose | Laterality: Bilateral | Wound class: Clean Contaminated

## 2015-12-02 MED ORDER — LIDOCAINE HCL (CARDIAC) 20 MG/ML IV SOLN
INTRAVENOUS | Status: DC | PRN
  Administered 2015-12-02: 40 mg via INTRAVENOUS

## 2015-12-02 MED ORDER — PROMETHAZINE HCL 25 MG/ML IJ SOLN
6.2500 mg | INTRAMUSCULAR | Status: DC | PRN
Start: 2015-12-02 — End: 2015-12-02

## 2015-12-02 MED ORDER — PHENYLEPHRINE HCL 0.5 % NA SOLN
NASAL | Status: DC | PRN
  Administered 2015-12-02: 30 mL via TOPICAL

## 2015-12-02 MED ORDER — PROPOFOL 10 MG/ML IV BOLUS
INTRAVENOUS | Status: DC | PRN
  Administered 2015-12-02: 50 mg via INTRAVENOUS
  Administered 2015-12-02: 150 mg via INTRAVENOUS

## 2015-12-02 MED ORDER — ONDANSETRON HCL 4 MG/2ML IJ SOLN
INTRAMUSCULAR | Status: DC | PRN
  Administered 2015-12-02: 4 mg via INTRAVENOUS

## 2015-12-02 MED ORDER — DEXAMETHASONE SODIUM PHOSPHATE 4 MG/ML IJ SOLN
INTRAMUSCULAR | Status: DC | PRN
  Administered 2015-12-02: 12 mg via INTRAVENOUS

## 2015-12-02 MED ORDER — CEFAZOLIN SODIUM-DEXTROSE 2-4 GM/100ML-% IV SOLN
2.0000 g | Freq: Once | INTRAVENOUS | Status: AC
  Administered 2015-12-02: 2 g via INTRAVENOUS

## 2015-12-02 MED ORDER — GLYCOPYRROLATE 0.2 MG/ML IJ SOLN
INTRAMUSCULAR | Status: DC | PRN
  Administered 2015-12-02: 0.1 mg via INTRAVENOUS

## 2015-12-02 MED ORDER — HYDROMORPHONE HCL 1 MG/ML IJ SOLN
0.2500 mg | INTRAMUSCULAR | Status: DC | PRN
  Administered 2015-12-02: 0.4 mg via INTRAVENOUS

## 2015-12-02 MED ORDER — FENTANYL CITRATE (PF) 100 MCG/2ML IJ SOLN
INTRAMUSCULAR | Status: DC | PRN
  Administered 2015-12-02: 100 ug via INTRAVENOUS

## 2015-12-02 MED ORDER — LIDOCAINE-EPINEPHRINE 1 %-1:100000 IJ SOLN
INTRAMUSCULAR | Status: DC | PRN
  Administered 2015-12-02: 4 mL

## 2015-12-02 MED ORDER — OXYCODONE HCL 5 MG PO TABS
5.0000 mg | ORAL_TABLET | Freq: Once | ORAL | Status: AC | PRN
  Administered 2015-12-02: 5 mg via ORAL

## 2015-12-02 MED ORDER — OXYCODONE HCL 5 MG/5ML PO SOLN: 5.0000 mg | Freq: Once | ORAL | Status: AC | PRN

## 2015-12-02 MED ORDER — ROCURONIUM BROMIDE 100 MG/10ML IV SOLN
INTRAVENOUS | Status: DC | PRN
  Administered 2015-12-02: 20 mg via INTRAVENOUS

## 2015-12-02 MED ORDER — LACTATED RINGERS IV SOLN
INTRAVENOUS | Status: DC
  Administered 2015-12-02: 10:00:00 via INTRAVENOUS

## 2015-12-02 MED ORDER — OXYMETAZOLINE HCL 0.05 % NA SOLN
1.0000 | Freq: Two times a day (BID) | NASAL | Status: DC
  Administered 2015-12-02: 2 via NASAL

## 2015-12-02 MED ORDER — ACETAMINOPHEN 10 MG/ML IV SOLN
INTRAVENOUS | Status: DC | PRN
  Administered 2015-12-02: 1000 mg via INTRAVENOUS

## 2015-12-02 SURGICAL SUPPLY — 29 items
BLADE SURG 15 STRL LF DISP TIS (BLADE) IMPLANT
BLADE SURG 15 STRL SS (BLADE)
CANISTER SUCT 1200ML W/VALVE (MISCELLANEOUS) ×2 IMPLANT
COAG SUCT 10F 3.5MM HAND CTRL (MISCELLANEOUS) ×2 IMPLANT
DRAPE HEAD BAR (DRAPES) ×2 IMPLANT
DRESSING NASL FOAM PST OP SINU (MISCELLANEOUS) IMPLANT
DRSG NASAL FOAM POST OP SINU (MISCELLANEOUS)
GLOVE PI ULTRA LF STRL 7.5 (GLOVE) ×2 IMPLANT
GLOVE PI ULTRA NON LATEX 7.5 (GLOVE) ×2
KIT ROOM TURNOVER OR (KITS) ×2 IMPLANT
NEEDLE HYPO 25GX1X1/2 BEV (NEEDLE) ×2 IMPLANT
NS IRRIG 500ML POUR BTL (IV SOLUTION) ×2 IMPLANT
PACK DRAPE NASAL/ENT (PACKS) ×2 IMPLANT
PACKING NASAL EPIS 4X2.4 XEROG (MISCELLANEOUS) IMPLANT
PAD GROUND ADULT SPLIT (MISCELLANEOUS) ×2 IMPLANT
PATTIES SURGICAL .5 X3 (DISPOSABLE) ×2 IMPLANT
SPLINT NASAL SEPTAL BLV .50 ST (MISCELLANEOUS) ×2 IMPLANT
STRAP BODY AND KNEE 60X3 (MISCELLANEOUS) ×4 IMPLANT
SUT CHROMIC 3-0 (SUTURE) ×1
SUT CHROMIC 3-0 KS 27XMFL CR (SUTURE) ×1
SUT ETHILON 3-0 KS 30 BLK (SUTURE) ×2 IMPLANT
SUT ETHILON 4-0 (SUTURE)
SUT ETHILON 4-0 FS2 18XMFL BLK (SUTURE)
SUT PLAIN GUT 4-0 (SUTURE) ×4 IMPLANT
SUTURE CHRMC 3-0 KS 27XMFL CR (SUTURE) ×1 IMPLANT
SUTURE ETHLN 4-0 FS2 18XMF BLK (SUTURE) IMPLANT
SYR 3ML LL SCALE MARK (SYRINGE) ×2 IMPLANT
TOWEL OR 17X26 4PK STRL BLUE (TOWEL DISPOSABLE) ×2 IMPLANT
WATER STERILE IRR 500ML POUR (IV SOLUTION) IMPLANT

## 2015-12-02 NOTE — Anesthesia Postprocedure Evaluation (Signed)
Anesthesia Post Note  Patient: Keith Reilly  Procedure(s) Performed: Procedure(s) (LRB): SEPTOPLASTY REVISION (Bilateral)  Patient location during evaluation: PACU Anesthesia Type: General Level of consciousness: awake and alert Pain management: pain level controlled Vital Signs Assessment: post-procedure vital signs reviewed and stable Respiratory status: spontaneous breathing, nonlabored ventilation, respiratory function stable and patient connected to nasal cannula oxygen Cardiovascular status: blood pressure returned to baseline and stable Postop Assessment: no signs of nausea or vomiting Anesthetic complications: no    Scarlette Sliceachel B Xaviera Flaten

## 2015-12-02 NOTE — H&P (Signed)
  H&P has been reviewed and no changes necessary. To be downloaded later. 

## 2015-12-02 NOTE — Anesthesia Preprocedure Evaluation (Signed)
Anesthesia Evaluation  Patient identified by MRN, date of birth, ID band Patient awake    Reviewed: Allergy & Precautions, H&P , NPO status , Patient's Chart, lab work & pertinent test results, reviewed documented beta blocker date and time   Airway Mallampati: II  TM Distance: >3 FB Neck ROM: full    Dental no notable dental hx.    Pulmonary neg pulmonary ROS,    Pulmonary exam normal breath sounds clear to auscultation       Cardiovascular Exercise Tolerance: Good hypertension,  Rhythm:regular Rate:Normal     Neuro/Psych PSYCHIATRIC DISORDERS (anxiety) negative neurological ROS     GI/Hepatic negative GI ROS, Neg liver ROS,   Endo/Other  diabetes, Type 2  Renal/GU negative Renal ROS  negative genitourinary   Musculoskeletal   Abdominal   Peds  Hematology negative hematology ROS (+)   Anesthesia Other Findings   Reproductive/Obstetrics negative OB ROS                             Anesthesia Physical Anesthesia Plan  ASA: II  Anesthesia Plan: General   Post-op Pain Management:    Induction:   Airway Management Planned:   Additional Equipment:   Intra-op Plan:   Post-operative Plan:   Informed Consent: I have reviewed the patients History and Physical, chart, labs and discussed the procedure including the risks, benefits and alternatives for the proposed anesthesia with the patient or authorized representative who has indicated his/her understanding and acceptance.   Dental Advisory Given  Plan Discussed with: CRNA  Anesthesia Plan Comments:         Anesthesia Quick Evaluation

## 2015-12-02 NOTE — Anesthesia Procedure Notes (Addendum)
Procedure Name: Intubation Date/Time: 12/02/2015 10:40 AM Performed by: Jimmy PicketAMYOT, Rachelanne Whidby Pre-anesthesia Checklist: Patient identified, Emergency Drugs available, Suction available, Patient being monitored and Timeout performed Patient Re-evaluated:Patient Re-evaluated prior to inductionOxygen Delivery Method: Circle system utilized Preoxygenation: Pre-oxygenation with 100% oxygen Intubation Type: IV induction Ventilation: Mask ventilation without difficulty Laryngoscope Size: Glidescope Tube type: Oral Rae Tube size: 7.0 mm Number of attempts: 3 Placement Confirmation: ETT inserted through vocal cords under direct vision,  positive ETCO2 and breath sounds checked- equal and bilateral Tube secured with: Tape Dental Injury: Teeth and Oropharynx as per pre-operative assessment  Comments: Attempted to intubated with Miller 3 x 1 without success. Dr. Franky MachoBeach DL x1 with miller 4. Unable to visualize cords. Glidescope x 1 with grade I view by Dr. Franky MachoBeach. Small nick to lip noted to Center and Right upper lip. Dr. Franky MachoBeach and Dr. Elenore RotaJuengel aware of lip.

## 2015-12-02 NOTE — Transfer of Care (Signed)
Immediate Anesthesia Transfer of Care Note  Patient: Keith Reilly  Procedure(s) Performed: Procedure(s) with comments: SEPTOPLASTY REVISION (Bilateral) - DIABETIC  Patient Location: PACU  Anesthesia Type: General  Level of Consciousness: awake, alert  and patient cooperative  Airway and Oxygen Therapy: Patient Spontanous Breathing and Patient connected to supplemental oxygen  Post-op Assessment: Post-op Vital signs reviewed, Patient's Cardiovascular Status Stable, Respiratory Function Stable, Patent Airway and No signs of Nausea or vomiting  Post-op Vital Signs: Reviewed and stable  Complications: No apparent anesthesia complications

## 2015-12-02 NOTE — Op Note (Signed)
12/02/2015  11:57 AM    Haverstick, Tinnie GensJeffrey  [id number]   Pre-Op Dx:  Septal deviation causing airway obstruction  Post-op Dx: Septal deviation causing airway obstruction  Proc: Septoplasty revision   Surg:  Alexxander Kurt H  Anes:  GOT  EBL:  50 mL  Comp:  None  Findings:  The posterior quadrangular cartilage was bowing off into the right nasal airway obstructing the right side. There was a 3 mm septal perfect posteriorly and inferiorly just above the maxillary crest.  Procedure: The patient was given general anesthesia by oral endotracheal intubation. He was in the supine position on the operating table. The nose prepped using 4 mL of 1% Xylocaine with epi 1 200,000 for infiltration into the nasal septum and lateral nasal walls. Cottonoid pledgets were soaked in phenylephrine and Xylocaine and placed on both sides the nose in the normal fashion. His prepped and draped sterile fashion.  The 0 scope was used to visualize the airway. The regular cartilage was buckling to the right side but the bony septum posteriorly was straight. A right Killian incision was created with elevation of mucoperichondrium on the right side the quadrangular plate. The posterior quadrangular plate was freed up and a portion of this was trimmed that was bowing to the left side. The remainder of the cartilage was scored slightly to help prevent further bowing. The mucosal flaps were placed back into their anatomic position and this now allowed the septum to sit the midline without any tension pulling it would right side. Using the endoscope the edges of the small perforation were freshened up and allow healing. The 40 plain gut suture was used in a through and through whip stitch fashion for closure of the mucosal flaps and then was used to sew around the small septal perforation to pull together to allow this to heal. I had to sew this endoscopically to make sure I was getting around the small hole bulla together and  make sure it was closed. Some 3-0 chromic sutures were used anteriorly to help anchor the septum in the midline again. The 0 scope was then used to visualize both sides nasal airway and was nice and clear and the small posterior septal perforation was sealed. A regular sized Xomed splint was trimmed and placed on both sides the nasal septum and held in position with a 3-0 nylon through and through suture.  The patient tolerated the procedure well. He was awakened taken to the recovery room in satisfactory condition. There were no operative complications.  Dispo:   To PACU to be discharged home  Plan:  To follow-up in the office in 1 week for splint removal. To take it easy at home for the weekend. We'll use saline flushes to help wash the splints cleared.  Raechell Singleton H  12/02/2015 11:57 AM

## 2015-12-03 ENCOUNTER — Encounter: Payer: Self-pay | Admitting: Otolaryngology

## 2016-09-22 ENCOUNTER — Other Ambulatory Visit: Payer: Self-pay | Admitting: Nurse Practitioner

## 2016-09-22 DIAGNOSIS — R51 Headache: Principal | ICD-10-CM

## 2016-09-22 DIAGNOSIS — R519 Headache, unspecified: Secondary | ICD-10-CM

## 2016-10-02 ENCOUNTER — Ambulatory Visit
Admission: RE | Admit: 2016-10-02 | Discharge: 2016-10-02 | Disposition: A | Discharge status: Home / Self Care | Payer: BC Managed Care – PPO | Source: Ambulatory Visit | Attending: Nurse Practitioner | Admitting: Nurse Practitioner

## 2016-10-02 DIAGNOSIS — R51 Headache: Secondary | ICD-10-CM | POA: Insufficient documentation

## 2016-10-02 DIAGNOSIS — Z8673 Personal history of transient ischemic attack (TIA), and cerebral infarction without residual deficits: Secondary | ICD-10-CM | POA: Insufficient documentation

## 2016-10-02 DIAGNOSIS — R519 Headache, unspecified: Secondary | ICD-10-CM

## 2017-12-06 ENCOUNTER — Other Ambulatory Visit: Payer: Self-pay | Admitting: Orthopedic Surgery

## 2017-12-10 ENCOUNTER — Other Ambulatory Visit: Payer: Self-pay | Admitting: Orthopedic Surgery

## 2017-12-10 DIAGNOSIS — M2391 Unspecified internal derangement of right knee: Secondary | ICD-10-CM

## 2017-12-10 DIAGNOSIS — M25561 Pain in right knee: Secondary | ICD-10-CM

## 2017-12-10 DIAGNOSIS — G8929 Other chronic pain: Secondary | ICD-10-CM

## 2017-12-14 ENCOUNTER — Ambulatory Visit
Admission: RE | Admit: 2017-12-14 | Discharge: 2017-12-14 | Disposition: A | Discharge status: Home / Self Care | Payer: BC Managed Care – PPO | Source: Ambulatory Visit | Attending: Orthopedic Surgery | Admitting: Orthopedic Surgery

## 2017-12-14 DIAGNOSIS — M2391 Unspecified internal derangement of right knee: Secondary | ICD-10-CM

## 2017-12-14 DIAGNOSIS — M25561 Pain in right knee: Principal | ICD-10-CM

## 2017-12-14 DIAGNOSIS — G8929 Other chronic pain: Secondary | ICD-10-CM

## 2017-12-19 ENCOUNTER — Other Ambulatory Visit: Payer: BC Managed Care – PPO

## 2017-12-21 ENCOUNTER — Other Ambulatory Visit
Admission: RE | Admit: 2017-12-21 | Discharge: 2017-12-21 | Disposition: A | Discharge status: Home / Self Care | Payer: BC Managed Care – PPO | Source: Ambulatory Visit | Attending: Sports Medicine | Admitting: Sports Medicine

## 2017-12-21 DIAGNOSIS — G8929 Other chronic pain: Secondary | ICD-10-CM | POA: Insufficient documentation

## 2017-12-21 DIAGNOSIS — M25561 Pain in right knee: Secondary | ICD-10-CM | POA: Diagnosis present

## 2017-12-21 DIAGNOSIS — M25461 Effusion, right knee: Secondary | ICD-10-CM | POA: Insufficient documentation

## 2017-12-21 LAB — SYNOVIAL CELL COUNT + DIFF, W/ CRYSTALS
CRYSTALS FLUID: NONE SEEN
Eosinophils-Synovial: 0 %
LYMPHOCYTES-SYNOVIAL FLD: 56 %
MONOCYTE-MACROPHAGE-SYNOVIAL FLUID: 43 %
Neutrophil, Synovial: 1 %
Other Cells-SYN: 0
WBC, Synovial: 5637 /mm3 — ABNORMAL HIGH (ref 0–200)

## 2017-12-25 LAB — BODY FLUID CULTURE: CULTURE: NO GROWTH

## 2018-03-05 ENCOUNTER — Other Ambulatory Visit: Payer: Self-pay | Admitting: Orthopedic Surgery

## 2018-03-05 ENCOUNTER — Other Ambulatory Visit: Payer: Self-pay | Admitting: Internal Medicine

## 2018-03-05 ENCOUNTER — Ambulatory Visit (HOSPITAL_COMMUNITY)
Admission: RE | Admit: 2018-03-05 | Discharge: 2018-03-05 | Disposition: A | Discharge status: Home / Self Care | Payer: BC Managed Care – PPO | Source: Ambulatory Visit | Attending: Internal Medicine | Admitting: Internal Medicine

## 2018-03-05 DIAGNOSIS — M25461 Effusion, right knee: Secondary | ICD-10-CM | POA: Insufficient documentation

## 2018-03-05 DIAGNOSIS — G8929 Other chronic pain: Secondary | ICD-10-CM | POA: Insufficient documentation

## 2018-03-05 DIAGNOSIS — M25561 Pain in right knee: Principal | ICD-10-CM

## 2018-03-05 DIAGNOSIS — M6751 Plica syndrome, right knee: Secondary | ICD-10-CM | POA: Insufficient documentation

## 2018-03-05 DIAGNOSIS — M9250 Juvenile osteochondrosis of tibia and fibula, unspecified leg: Secondary | ICD-10-CM | POA: Insufficient documentation

## 2018-03-07 ENCOUNTER — Encounter
Admission: RE | Admit: 2018-03-07 | Discharge: 2018-03-07 | Disposition: A | Discharge status: Home / Self Care | Payer: BC Managed Care – PPO | Source: Ambulatory Visit | Attending: Orthopedic Surgery | Admitting: Orthopedic Surgery

## 2018-03-07 ENCOUNTER — Encounter: Payer: Self-pay | Admitting: Anesthesiology

## 2018-03-07 ENCOUNTER — Other Ambulatory Visit: Payer: Self-pay

## 2018-03-07 DIAGNOSIS — E119 Type 2 diabetes mellitus without complications: Secondary | ICD-10-CM | POA: Diagnosis not present

## 2018-03-07 DIAGNOSIS — I10 Essential (primary) hypertension: Secondary | ICD-10-CM | POA: Diagnosis not present

## 2018-03-07 DIAGNOSIS — Z01818 Encounter for other preprocedural examination: Secondary | ICD-10-CM | POA: Insufficient documentation

## 2018-03-07 HISTORY — DX: Personal history of urinary calculi: Z87.442

## 2018-03-07 HISTORY — DX: Cerebral infarction, unspecified: I63.9

## 2018-03-07 NOTE — Patient Instructions (Signed)
Your procedure is scheduled on: 03/08/18 Report to DAY SURGERY DEPARTMENT LOCATED ON 2ND FLOOR MEDICAL MALL ENTRANCE. To find out your arrival time please call [phone] between 1PM - 3PM on 03/07/18.  Remember: Instructions that are not followed completely may result in serious medical risk, up to and including death, or upon the discretion of your surgeon and anesthesiologist your surgery may need to be rescheduled.     _X__ 1. Do not eat food after midnight the night before your procedure.                 No gum chewing or hard candies. You may drink clear liquids up to 2 hours                 before you are scheduled to arrive for your surgery- DO not drink clear                 liquids within 2 hours of the start of your surgery.                 Clear Liquids include:  water, apple juice without pulp, clear carbohydrate                 drink such as Clearfast or Gatorade, Black Coffee or Tea (Do not add                 anything to coffee or tea).  __X__2.  On the morning of surgery brush your teeth with toothpaste and water, you                 may rinse your mouth with mouthwash if you wish.  Do not swallow any              toothpaste of mouthwash.     _X__ 3.  No Alcohol for 24 hours before or after surgery.   _X__ 4.  Do Not Smoke or use e-cigarettes For 24 Hours Prior to Your Surgery.                 Do not use any chewable tobacco products for at least 6 hours prior to                 surgery.  ____  5.  Bring all medications with you on the day of surgery if instructed.   __X__  6.  Notify your doctor if there is any change in your medical condition      (cold, fever, infections).     Do not wear jewelry, make-up, hairpins, clips or nail polish. Do not wear lotions, powders, or perfumes.  Do not shave 48 hours prior to surgery. Men may shave face and neck. Do not bring valuables to the hospital.    [facility] is not responsible for any belongings or  valuables.  Contacts, dentures/partials or body piercings may not be worn into surgery. Bring a case for your contacts, glasses or hearing aids, a denture cup will be supplied. Leave your suitcase in the car. After surgery it may be brought to your room. For patients admitted to the hospital, discharge time is determined by your treatment team.   Patients discharged the day of surgery will not be allowed to drive home.   Please read over the following fact sheets that you were given:   MRSA Information  __X__ Take these medicines the morning of surgery with A SIP OF WATER:    1. METOPROLOL  2.   3.   4.  5.  6.  ____ Fleet Enema (as directed)   __X__ Use CHG Soap/SAGE wipes as directed  ____ Use inhalers on the day of surgery  __X__ Stop metformin/Janumet/Farxiga 2 days prior to surgery HOLD TODAY AND TOMORROW   ____ Take 1/2 of usual insulin dose the night before surgery. No insulin the morning          of surgery.   ____ Stop Blood Thinners Coumadin/Plavix/Xarelto/Pleta/Pradaxa/Eliquis/Effient/Aspirin  on   Or contact your Surgeon, Cardiologist or Medical Doctor regarding  ability to stop your blood thinners  __X__ Stop Anti-inflammatories 7 days before surgery such as Advil, Ibuprofen, Motrin,  BC or Goodies Powder, Naprosyn, Naproxen, Aleve, Aspirin  HOLD NOW   __X__ Stop all herbal supplements, fish oil or vitamin E until after surgery. AND MELATONIN HOLD NOW   ____ Bring C-Pap to the hospital.

## 2018-03-08 ENCOUNTER — Encounter
Admission: RE | Disposition: A | Discharge status: Home / Self Care | Payer: Self-pay | Source: Ambulatory Visit | Attending: Orthopedic Surgery

## 2018-03-08 ENCOUNTER — Encounter: Payer: Self-pay | Admitting: *Deleted

## 2018-03-08 ENCOUNTER — Ambulatory Visit: Payer: BC Managed Care – PPO | Admitting: Anesthesiology

## 2018-03-08 ENCOUNTER — Ambulatory Visit
Admission: RE | Admit: 2018-03-08 | Discharge: 2018-03-08 | Disposition: A | Discharge status: Home / Self Care | Payer: BC Managed Care – PPO | Source: Ambulatory Visit | Attending: Orthopedic Surgery | Admitting: Orthopedic Surgery

## 2018-03-08 DIAGNOSIS — E785 Hyperlipidemia, unspecified: Secondary | ICD-10-CM | POA: Insufficient documentation

## 2018-03-08 DIAGNOSIS — Z79899 Other long term (current) drug therapy: Secondary | ICD-10-CM | POA: Diagnosis not present

## 2018-03-08 DIAGNOSIS — Z7982 Long term (current) use of aspirin: Secondary | ICD-10-CM | POA: Diagnosis not present

## 2018-03-08 DIAGNOSIS — Z6830 Body mass index (BMI) 30.0-30.9, adult: Secondary | ICD-10-CM | POA: Diagnosis not present

## 2018-03-08 DIAGNOSIS — M25461 Effusion, right knee: Secondary | ICD-10-CM | POA: Insufficient documentation

## 2018-03-08 DIAGNOSIS — Z8673 Personal history of transient ischemic attack (TIA), and cerebral infarction without residual deficits: Secondary | ICD-10-CM | POA: Diagnosis not present

## 2018-03-08 DIAGNOSIS — G8929 Other chronic pain: Secondary | ICD-10-CM | POA: Insufficient documentation

## 2018-03-08 DIAGNOSIS — E119 Type 2 diabetes mellitus without complications: Secondary | ICD-10-CM | POA: Insufficient documentation

## 2018-03-08 DIAGNOSIS — M6751 Plica syndrome, right knee: Secondary | ICD-10-CM | POA: Diagnosis present

## 2018-03-08 DIAGNOSIS — I10 Essential (primary) hypertension: Secondary | ICD-10-CM | POA: Diagnosis not present

## 2018-03-08 DIAGNOSIS — F419 Anxiety disorder, unspecified: Secondary | ICD-10-CM | POA: Diagnosis not present

## 2018-03-08 DIAGNOSIS — Z7984 Long term (current) use of oral hypoglycemic drugs: Secondary | ICD-10-CM | POA: Insufficient documentation

## 2018-03-08 DIAGNOSIS — M1711 Unilateral primary osteoarthritis, right knee: Secondary | ICD-10-CM | POA: Insufficient documentation

## 2018-03-08 DIAGNOSIS — M65861 Other synovitis and tenosynovitis, right lower leg: Secondary | ICD-10-CM | POA: Insufficient documentation

## 2018-03-08 DIAGNOSIS — Z8249 Family history of ischemic heart disease and other diseases of the circulatory system: Secondary | ICD-10-CM | POA: Diagnosis not present

## 2018-03-08 DIAGNOSIS — M23251 Derangement of posterior horn of lateral meniscus due to old tear or injury, right knee: Secondary | ICD-10-CM | POA: Insufficient documentation

## 2018-03-08 DIAGNOSIS — M419 Scoliosis, unspecified: Secondary | ICD-10-CM | POA: Diagnosis not present

## 2018-03-08 DIAGNOSIS — M25561 Pain in right knee: Secondary | ICD-10-CM | POA: Diagnosis not present

## 2018-03-08 HISTORY — DX: Other seasonal allergic rhinitis: J30.2

## 2018-03-08 HISTORY — PX: KNEE ARTHROSCOPY WITH MEDIAL MENISECTOMY: SHX5651

## 2018-03-08 LAB — SYNOVIAL CELL COUNT + DIFF, W/ CRYSTALS
Crystals, Fluid: NONE SEEN
Eosinophils-Synovial: 1 %
Lymphocytes-Synovial Fld: 21 %
Monocyte-Macrophage-Synovial Fluid: 0 %
Neutrophil, Synovial: 78 %
Other Cells-SYN: 0
WBC, Synovial: 2964 /cu mm — ABNORMAL HIGH (ref 0–200)

## 2018-03-08 LAB — GLUCOSE, CAPILLARY
Glucose-Capillary: 173 mg/dL — ABNORMAL HIGH (ref 70–99)
Glucose-Capillary: 214 mg/dL — ABNORMAL HIGH (ref 70–99)

## 2018-03-08 SURGERY — ARTHROSCOPY, KNEE, WITH MEDIAL MENISCECTOMY
Anesthesia: General | Site: Knee | Laterality: Right | Wound class: Clean

## 2018-03-08 MED ORDER — LIDOCAINE-EPINEPHRINE 1 %-1:100000 IJ SOLN
INTRAMUSCULAR | Status: DC | PRN
  Administered 2018-03-08: 4 mL

## 2018-03-08 MED ORDER — PROPOFOL 10 MG/ML IV BOLUS
INTRAVENOUS | Status: AC
  Filled 2018-03-08: qty 40

## 2018-03-08 MED ORDER — FENTANYL CITRATE (PF) 100 MCG/2ML IJ SOLN
INTRAMUSCULAR | Status: AC
  Administered 2018-03-08: 25 ug via INTRAVENOUS
  Filled 2018-03-08: qty 2

## 2018-03-08 MED ORDER — FAMOTIDINE 20 MG PO TABS
20.0000 mg | ORAL_TABLET | Freq: Once | ORAL | Status: AC
  Administered 2018-03-08: 20 mg via ORAL

## 2018-03-08 MED ORDER — FENTANYL CITRATE (PF) 100 MCG/2ML IJ SOLN
INTRAMUSCULAR | Status: DC | PRN
  Administered 2018-03-08 (×5): 25 ug via INTRAVENOUS

## 2018-03-08 MED ORDER — ONDANSETRON 4 MG PO TBDP: 4.0000 mg | ORAL_TABLET | Freq: Three times a day (TID) | ORAL | 0 refills | Status: AC | PRN

## 2018-03-08 MED ORDER — METOPROLOL TARTRATE 50 MG PO TABS
50.0000 mg | ORAL_TABLET | Freq: Once | ORAL | Status: AC
  Administered 2018-03-08: 50 mg via ORAL

## 2018-03-08 MED ORDER — CEFAZOLIN SODIUM-DEXTROSE 2-4 GM/100ML-% IV SOLN
2.0000 g | Freq: Once | INTRAVENOUS | Status: AC
  Administered 2018-03-08: 2 g via INTRAVENOUS

## 2018-03-08 MED ORDER — LACTATED RINGERS IV SOLN
INTRAVENOUS | Status: DC | PRN
  Administered 2018-03-08: 4 mL

## 2018-03-08 MED ORDER — ONDANSETRON HCL 4 MG/2ML IJ SOLN
INTRAMUSCULAR | Status: AC
  Filled 2018-03-08: qty 2

## 2018-03-08 MED ORDER — ONDANSETRON HCL 4 MG/2ML IJ SOLN: 4.0000 mg | Freq: Once | INTRAMUSCULAR | Status: DC | PRN

## 2018-03-08 MED ORDER — FENTANYL CITRATE (PF) 250 MCG/5ML IJ SOLN
INTRAMUSCULAR | Status: AC
  Filled 2018-03-08: qty 5

## 2018-03-08 MED ORDER — KETOROLAC TROMETHAMINE 30 MG/ML IJ SOLN
INTRAMUSCULAR | Status: DC | PRN
  Administered 2018-03-08: 30 mg via INTRAVENOUS

## 2018-03-08 MED ORDER — MIDAZOLAM HCL 2 MG/2ML IJ SOLN
INTRAMUSCULAR | Status: AC
  Filled 2018-03-08: qty 2

## 2018-03-08 MED ORDER — FENTANYL CITRATE (PF) 100 MCG/2ML IJ SOLN
25.0000 ug | INTRAMUSCULAR | Status: AC | PRN
  Administered 2018-03-08 (×6): 25 ug via INTRAVENOUS

## 2018-03-08 MED ORDER — OXYCODONE HCL 5 MG PO TABS: 5.0000 mg | ORAL_TABLET | ORAL | 0 refills | Status: AC | PRN

## 2018-03-08 MED ORDER — LABETALOL HCL 5 MG/ML IV SOLN
INTRAVENOUS | Status: AC
  Filled 2018-03-08: qty 4

## 2018-03-08 MED ORDER — CEFAZOLIN SODIUM-DEXTROSE 2-4 GM/100ML-% IV SOLN
INTRAVENOUS | Status: AC
  Filled 2018-03-08: qty 100

## 2018-03-08 MED ORDER — PROPOFOL 10 MG/ML IV BOLUS
INTRAVENOUS | Status: DC | PRN
  Administered 2018-03-08: 180 mg via INTRAVENOUS

## 2018-03-08 MED ORDER — ACETAMINOPHEN 10 MG/ML IV SOLN
INTRAVENOUS | Status: AC
  Filled 2018-03-08: qty 100

## 2018-03-08 MED ORDER — LABETALOL HCL 5 MG/ML IV SOLN
INTRAVENOUS | Status: DC | PRN
  Administered 2018-03-08 (×2): 5 mg via INTRAVENOUS

## 2018-03-08 MED ORDER — LACTATED RINGERS IV SOLN
INTRAVENOUS | Status: DC | PRN
  Administered 2018-03-08: 10:00:00 via INTRAVENOUS

## 2018-03-08 MED ORDER — OXYCODONE HCL 5 MG PO TABS
ORAL_TABLET | ORAL | Status: AC
  Filled 2018-03-08: qty 1

## 2018-03-08 MED ORDER — PHENYLEPHRINE HCL 10 MG/ML IJ SOLN
INTRAMUSCULAR | Status: AC
  Filled 2018-03-08: qty 1

## 2018-03-08 MED ORDER — ONDANSETRON HCL 4 MG/2ML IJ SOLN
INTRAMUSCULAR | Status: DC | PRN
  Administered 2018-03-08: 4 mg via INTRAVENOUS

## 2018-03-08 MED ORDER — SODIUM CHLORIDE 0.9 % IV SOLN
INTRAVENOUS | Status: DC
  Administered 2018-03-08: 75 mL/h via INTRAVENOUS

## 2018-03-08 MED ORDER — METOPROLOL TARTRATE 50 MG PO TABS
ORAL_TABLET | ORAL | Status: AC
  Administered 2018-03-08: 50 mg via ORAL
  Filled 2018-03-08: qty 1

## 2018-03-08 MED ORDER — GLYCOPYRROLATE 0.2 MG/ML IJ SOLN
INTRAMUSCULAR | Status: DC | PRN
  Administered 2018-03-08: 0.2 mg via INTRAVENOUS

## 2018-03-08 MED ORDER — FAMOTIDINE 20 MG PO TABS
ORAL_TABLET | ORAL | Status: AC
  Administered 2018-03-08: 20 mg via ORAL
  Filled 2018-03-08: qty 1

## 2018-03-08 MED ORDER — ACETAMINOPHEN 10 MG/ML IV SOLN
INTRAVENOUS | Status: DC | PRN
  Administered 2018-03-08: 1000 mg via INTRAVENOUS

## 2018-03-08 MED ORDER — DEXAMETHASONE SODIUM PHOSPHATE 10 MG/ML IJ SOLN
INTRAMUSCULAR | Status: DC | PRN
Start: 2018-03-08 — End: 2018-03-08
  Administered 2018-03-08: 5 mg via INTRAVENOUS

## 2018-03-08 MED ORDER — EPHEDRINE SULFATE 50 MG/ML IJ SOLN
INTRAMUSCULAR | Status: AC
  Filled 2018-03-08: qty 1

## 2018-03-08 MED ORDER — KETOROLAC TROMETHAMINE 30 MG/ML IJ SOLN
INTRAMUSCULAR | Status: AC
  Filled 2018-03-08: qty 1

## 2018-03-08 MED ORDER — LIDOCAINE HCL (CARDIAC) PF 100 MG/5ML IV SOSY
PREFILLED_SYRINGE | INTRAVENOUS | Status: DC | PRN
  Administered 2018-03-08: 100 mg via INTRAVENOUS

## 2018-03-08 MED ORDER — MIDAZOLAM HCL 2 MG/2ML IJ SOLN
INTRAMUSCULAR | Status: DC | PRN
  Administered 2018-03-08: 2 mg via INTRAVENOUS

## 2018-03-08 MED ORDER — OXYCODONE HCL 5 MG PO TABS
5.0000 mg | ORAL_TABLET | ORAL | Status: DC | PRN
  Administered 2018-03-08: 5 mg via ORAL
  Filled 2018-03-08: qty 1

## 2018-03-08 MED ORDER — LIDOCAINE HCL (PF) 2 % IJ SOLN
INTRAMUSCULAR | Status: AC
  Filled 2018-03-08: qty 10

## 2018-03-08 MED ORDER — ASPIRIN EC 325 MG PO TBEC: 325.0000 mg | DELAYED_RELEASE_TABLET | Freq: Every day | ORAL | 0 refills | Status: AC

## 2018-03-08 SURGICAL SUPPLY — 51 items
ADAPTER IRRIG TUBE 2 SPIKE SOL (ADAPTER) ×4 IMPLANT
BANDAGE ACE 6X5 VEL STRL LF (GAUZE/BANDAGES/DRESSINGS) ×2 IMPLANT
BLADE SURG SZ11 CARB STEEL (BLADE) ×2 IMPLANT
BNDG COHESIVE 6X5 TAN STRL LF (GAUZE/BANDAGES/DRESSINGS) ×2 IMPLANT
BNDG ESMARK 6X12 TAN STRL LF (GAUZE/BANDAGES/DRESSINGS) ×2 IMPLANT
BUR RADIUS 3.5 (BURR) ×2 IMPLANT
BUR RADIUS 4.0X18.5 (BURR) IMPLANT
CAST PADDING 6X4YD ST 30248 (SOFTGOODS) ×1
CHLORAPREP W/TINT 26ML (MISCELLANEOUS) ×2 IMPLANT
COOLER POLAR GLACIER W/PUMP (MISCELLANEOUS) ×2 IMPLANT
CUFF TOURN 24 STER (MISCELLANEOUS) IMPLANT
CUFF TOURN 30 STER DUAL PORT (MISCELLANEOUS) ×2 IMPLANT
DEVICE SUCT BLK HOLE OR FLOOR (MISCELLANEOUS) ×2 IMPLANT
DRAPE IMP U-DRAPE 54X76 (DRAPES) ×2 IMPLANT
DRAPE LEGGINS SURG 28X43 STRL (DRAPES) IMPLANT
ELECT REM PT RETURN 9FT ADLT (ELECTROSURGICAL)
ELECTRODE REM PT RTRN 9FT ADLT (ELECTROSURGICAL) IMPLANT
GAUZE SPONGE 4X4 12PLY STRL (GAUZE/BANDAGES/DRESSINGS) ×2 IMPLANT
GLOVE BIOGEL PI IND STRL 8 (GLOVE) ×1 IMPLANT
GLOVE BIOGEL PI INDICATOR 8 (GLOVE) ×1
GLOVE SURG ORTHO 8.0 STRL STRW (GLOVE) ×4 IMPLANT
GOWN STRL REUS W/ TWL LRG LVL3 (GOWN DISPOSABLE) ×1 IMPLANT
GOWN STRL REUS W/ TWL XL LVL3 (GOWN DISPOSABLE) ×1 IMPLANT
GOWN STRL REUS W/TWL LRG LVL3 (GOWN DISPOSABLE) ×1
GOWN STRL REUS W/TWL XL LVL3 (GOWN DISPOSABLE) ×1
IV LACTATED RINGER IRRG 3000ML (IV SOLUTION) ×12
IV LR IRRIG 3000ML ARTHROMATIC (IV SOLUTION) ×12 IMPLANT
KIT TURNOVER KIT A (KITS) ×2 IMPLANT
MANIFOLD NEPTUNE II (INSTRUMENTS) ×2 IMPLANT
MAT BLUE FLOOR 46X72 FLO (MISCELLANEOUS) IMPLANT
NDL MAYO CATGUT SZ5 (NEEDLE)
NDL SUT 5 .5 CRC TPR PNT MAYO (NEEDLE) IMPLANT
NEEDLE HYPO 22GX1.5 SAFETY (NEEDLE) ×2 IMPLANT
PACK ARTHROSCOPY KNEE (MISCELLANEOUS) ×2 IMPLANT
PAD ABD DERMACEA PRESS 5X9 (GAUZE/BANDAGES/DRESSINGS) ×4 IMPLANT
PAD WRAPON POLAR KNEE (MISCELLANEOUS) ×1 IMPLANT
PADDING CAST COTTON 6X4 ST (SOFTGOODS) ×1 IMPLANT
PENCIL ELECTRO HAND CTR (MISCELLANEOUS) IMPLANT
SET TUBE SUCT SHAVER OUTFL 24K (TUBING) ×2 IMPLANT
SET TUBE TIP INTRA-ARTICULAR (MISCELLANEOUS) ×2 IMPLANT
STRIP CLOSURE SKIN 1/2X4 (GAUZE/BANDAGES/DRESSINGS) IMPLANT
SUT ETHILON 3-0 FS-10 30 BLK (SUTURE) ×2
SUT MNCRL AB 4-0 PS2 18 (SUTURE) IMPLANT
SUT VIC AB 0 CT2 27 (SUTURE) IMPLANT
SUT VIC AB 2-0 CT2 27 (SUTURE) IMPLANT
SUTURE EHLN 3-0 FS-10 30 BLK (SUTURE) ×1 IMPLANT
TOWEL OR 17X26 4PK STRL BLUE (TOWEL DISPOSABLE) ×4 IMPLANT
TUBING ARTHRO INFLOW-ONLY STRL (TUBING) ×2 IMPLANT
WAND HAND CNTRL MULTIVAC 50 (MISCELLANEOUS) IMPLANT
WAND HAND CNTRL MULTIVAC 90 (MISCELLANEOUS) ×2 IMPLANT
WRAPON POLAR PAD KNEE (MISCELLANEOUS) ×2

## 2018-03-08 NOTE — Anesthesia Postprocedure Evaluation (Signed)
Anesthesia Post Note  Patient: Keith Reilly  Procedure(s) Performed: KNEE ARTHROSCOPY WITH PLICA EXCISION,SYNOVECTOMY CHONDROPLASTY, PARTIAL LATERAL MEDIAL MENISECTOMY (Right Knee)  Patient location during evaluation: PACU Anesthesia Type: General Level of consciousness: awake and alert Pain management: pain level controlled Vital Signs Assessment: post-procedure vital signs reviewed and stable Respiratory status: spontaneous breathing, nonlabored ventilation, respiratory function stable and patient connected to nasal cannula oxygen Cardiovascular status: blood pressure returned to baseline and stable Postop Assessment: no apparent nausea or vomiting Anesthetic complications: no     Last Vitals:  Vitals:   03/08/18 1315 03/08/18 1400  BP: 128/79 134/68  Pulse: 68 76  Resp: 16   Temp: 36.6 C   SpO2: 95% 97%    Last Pain:  Vitals:   03/08/18 1415  TempSrc:   PainSc: 3                  Naija Troost S

## 2018-03-08 NOTE — Transfer of Care (Signed)
Immediate Anesthesia Transfer of Care Note  Patient: Keith Reilly  Procedure(s) Performed: KNEE ARTHROSCOPY WITH PLICA EXCISION,SYNOVECTOMY CHONDROPLASTY, PARTIAL LATERAL MEDIAL MENISECTOMY (Right Knee)  Patient Location: PACU  Anesthesia Type:General  Level of Consciousness: awake, alert , oriented and patient cooperative  Airway & Oxygen Therapy: Patient Spontanous Breathing  Post-op Assessment: Report given to RN, Post -op Vital signs reviewed and stable and Patient moving all extremities  Post vital signs: Reviewed and stable  Last Vitals:  Vitals Value Taken Time  BP 146/86 03/08/2018 11:49 AM  Temp 37.2 C 03/08/2018 11:49 AM  Pulse 94 03/08/2018 11:56 AM  Resp 15 03/08/2018 11:56 AM  SpO2 100 % 03/08/2018 11:56 AM  Vitals shown include unvalidated device data.  Last Pain:  Vitals:   03/08/18 0853  TempSrc: Oral  PainSc: 5       Patients Stated Pain Goal: 1 (03/08/18 0853)  Complications: No apparent anesthesia complications

## 2018-03-08 NOTE — Anesthesia Procedure Notes (Signed)
Procedure Name: LMA Insertion Date/Time: 03/08/2018 10:01 AM Performed by: Henrietta HooverSmith, Mahesh Sizemore, CRNA Pre-anesthesia Checklist: Patient identified, Emergency Drugs available, Suction available and Patient being monitored Patient Re-evaluated:Patient Re-evaluated prior to induction Oxygen Delivery Method: Circle system utilized Preoxygenation: Pre-oxygenation with 100% oxygen Induction Type: IV induction Ventilation: Mask ventilation without difficulty LMA: LMA inserted LMA Size: 4.0 Number of attempts: 1 Placement Confirmation: positive ETCO2 and breath sounds checked- equal and bilateral Tube secured with: Tape Dental Injury: Teeth and Oropharynx as per pre-operative assessment

## 2018-03-08 NOTE — Discharge Instructions (Signed)
Arthroscopic Knee Surgery  Post-Op Instructions  1. Bracing or crutches: Crutches will be provided at the time of discharge from the surgery center if you do not already have them.  2. Ice: You may be provided with a device [facility](Polar Care) that allows you to ice the affected area effectively. Otherwise you can ice manually.   3. Driving:  Plan on not driving for at least one week. Please note that you are advised NOT to drive while taking narcotic pain medications as you may be impaired and unsafe to drive.  4. Activity: Ankle pumps several times an hour while awake to prevent blood clots. Weight bearing: as tolerated. Use crutches for as needed (usually ~1 week or less) until pain allows you to ambulate without a limp. Bending and straightening the knee is unlimited. Elevate knee above heart level as much as possible for one week. Avoid standing more than 5 minutes (consecutively) for the first week.  Avoid long distance travel for 2 weeks.  5. Medications:  - You have been provided a prescription for narcotic pain medicine. After surgery, take 1-2 narcotic tablets every 4 hours if needed for severe pain.  - A prescription for anti-nausea medication will be provided in case the narcotic medicine causes nausea - take 1 tablet every 6 hours only if nauseated.  - Take ibuprofen 800 mg every 8 hours WITH food to reduce post-operative knee swelling. DO NOT STOP IBUPROFEN POST-OP UNTIL INSTRUCTED TO DO SO at first post-op office visit (10-14 days after surgery). However, please discontinue if you have any abdominal discomfort after taking this.  - Take enteric coated aspirin 325 mg once daily for 2 weeks to prevent blood clots.   6. Bandages: The physical therapist should change the bandages at the first post-op appointment. If needed, the dressing supplies have been provided to you.  7. Physical Therapy: 1-2 times per week for 6 weeks. Therapy typically starts on post operative Day 3 or 4. You  have been provided an order for physical therapy. The therapist will provide home exercises.  8. Work: May return to full work usually around 2 weeks after 1st post-operative visit. May do light duty/desk job in approximately 1-2 weeks when off of narcotics, pain is well-controlled, and swelling has decreased. Labor intensive jobs may require 4-6 weeks to return.    9. Post-Op Appointments: Your first post-op appointment will be with Dr. Allena KatzPatel in approximately 2 weeks time.   If you find that they have not been scheduled please call the Orthopaedic Appointment front desk at [phone].

## 2018-03-08 NOTE — H&P (Signed)
Paper H&P to be scanned into permanent record. H&P reviewed. No significant changes noted.  

## 2018-03-08 NOTE — OR Nursing (Signed)
Discharge instructions discussed with pt and wife. Both voice understanding. 

## 2018-03-08 NOTE — Anesthesia Post-op Follow-up Note (Signed)
Anesthesia QCDR form completed.        

## 2018-03-08 NOTE — Op Note (Signed)
Operative Note   SURGERY DATE: 03/08/2018  PRE-OP DIAGNOSIS:  1. Right knee medial plica 2. Right knee synovitis  POST-OP DIAGNOSIS: 1. Right knee medial plica 2. Right knee synovitis 3. Right knee lateral meniscus tear  PROCEDURES:  1. Right knee arthroscopy, partial lateral meniscectomy 2. Right knee chondroplasty of patellofemoral compartment 3. Right knee medial plica excision  4. Right knee major synovectomy (patellofemoral and medial compartments) 5. Right knee joint aspiration  SURGEON: Rosealee AlbeeSunny H. Yehia Mcbain, MD  ANESTHESIA: Gen  ESTIMATED BLOOD LOSS:minimal  TOTAL IV FLUIDS: per anesthesia  INDICATION(S):  Samuel JesterJeffrey Meininger is a 52 y.o. male with chronic right knee pain. The patient is extremely frustrated with his quality of life and his pain as he is unable to ambulate without a limp and he is having difficulty sleeping due to his pain. To this date, he has had a corticosteroid injection with excellent relief, but only for 1 week. Additionally he had a rheumatologic work-up which was completely negative. I have also discussed his case with his rheumatologist, Dr. Renard MatterBock. She does not believe that there is any rheumatologic process going on, especially as he only has one joint that is affected, but does agree that this does seem to be an inflammatory process. She prescribed a 2-week prednisone taper that also gave him good relief of pain, but significantly elevated his blood glucose levels. He has been taking 1000 mg of Tylenol 3 times a day as well as 800 mg of ibuprofen 3 times a day without adequate relief of his symptoms. He does not wish to be on long-term prednisone. We discussed the possibility of one more corticosteroid injection, but given the limited duration of benefit from last corticosteroid injection, he does not wish to proceed with this either. We also discussed the possibility of PRP injections, but this would be completely out-of-pocket and there would be no way  to know ahead of time if he would actually benefit from this. The patient declined this as well.   Finally, we discussed the possibility of R knee diagnostic knee arthroscopy with possible synovectomy/plica excision, chondroplasty, and possible partial medial meniscectomy depending on the findings of the arthroscopy. We discussed at length that I do not know if he would necessarily have any long-term symptomatic relief from such a procedure and that surgery could make him worse. He would also have to undergo appropriate physical therapy postoperatively. We also discussed the specific risks of knee arthroscopy including, but not limited to, infection, bleeding, muscle nerve damage, DVT, lack of symptomatic improvement, and recurrence of pain if he did get pain relief. The patient is understanding of all of the above and still wishes to proceed with surgery.   OPERATIVE FINDINGS:   Examination under anesthesia:A careful examination under anesthesia was performed. Passive range of motion was: Hyperextension: 0. Extension: 0. Flexion: 120. Lachman: normal. Pivot Shift: normal. Posterior drawer: normal. Varus stability in full extension: normal. Varus stability in 30 degrees of flexion: normal. Valgus stability in full extension: normal. Valgus stability in 30 degrees of flexion: normal.  Intra-operative findings:A thorough arthroscopic examination of the knee was performed. The findings are: 1. Suprapatellar pouch: Significant synovitis 2. Undersurface of median ridge: areas of Grade 2 degenerative changes 3. Medial patellar facet: Grade 2 degenerative changes 4. Lateral patellar facet: areas of Grade 2 degenerative changes 5. Trochlea: Grade 2 degenerative changes 6. Lateral gutter/popliteus tendon: Normal 7. Hoffa's fat pad: Inflamed 8. Medial gutter/plica: Significant synovitis with presence of significant medial plica 9. ACL: Normal  10. PCL: Normal 11. Medial meniscus: Normal 12.  Medial compartment cartilage: areas of Grade 2 degenerative changes 13. Lateral meniscus: small vertical tear of the posterior horn of the lateral meniscus involving white-white zone affecting ~20% meniscus width 14. Lateral compartment cartilage: areas of Grade 2 degenerative changes  OPERATIVE REPORT:   I identified Malichi Dishmonin the pre-operative holding area. I marked theoperativeknee with my initials. I reviewed the risks and benefits of the proposed surgical intervention and the patient (and/or patient's guardian) wished to proceed. The patient was transferred to the operative suite and placed in the supine position with all bony prominences padded. Anesthesia was administered. Appropriate IV antibioticswere administered prior to incision. The extremity was then prepped and draped in standard fashion. A time out was performed confirming the correct extremity, correct patient, and correct procedure.  First, a knee joint aspiration was performed. Superolateral approach was used, and ~10cc of turbid, bloody synovial fluid was obtained. This was sent for cell count, crystals, and cultures (bacterial, fungal, and mycobacterium). The patient tolerated the procedure adequately. Aspiration was sent for cell count, culture/gram stain, and crystals.   Arthroscopy portals were marked. Local anesthetic was injected to the planned portal sites. The anterolateral portalwasestablished with an 11blade.   The arthroscope was placed in the anterolateral portal and theninto the suprapatellar pouch. A diagnostic knee scope was completed with the above findings. The lateral meniscus tear, medial plica, and areas of synovitis were identified.  Next the medial portal was established under needle localization. The inflamed Hoffa's fat pad was debrided. The lateral meniscal tear was debrided using an arthroscopic biter and an oscillating shaver until the meniscus had stable borders. A chondroplasty  was performed of the patellofemoral compartment such that there were stable cartilage edges without any loose fragments of cartilage. The medial plica was excised using the oscillating shaver. Areas of synovitis from about the medial gutter and patellofemoral joint were removed with an oscillating shaver and Arthrocare wand. Any notable areas of bleeding were coagulated with the Arthrocare wand. Arthroscopic fluid was removed from the joint.  The portals were closed with 3-0 Nylon suture. Sterile dressings included Xeroform, 4x4s, Sof-Rol, and Bias wrap. A Polarcare was placed.  The patient was then awakened and taken to the PACU hemodynamically stable without complication.   POSTOPERATIVE PLAN: The patient will be discharged home today once they meet PACU criteria. Aspirin 325 mg daily was prescribed for 2 weeks for DVT prophylaxis. Physical therapy will start on POD#3-4.Weight-bearing as tolerated. They will follow up in 2 weeks per protocol.

## 2018-03-08 NOTE — Anesthesia Preprocedure Evaluation (Signed)
Anesthesia Evaluation  Patient identified by MRN, date of birth, ID band Patient awake    Reviewed: Allergy & Precautions, NPO status , Patient's Chart, lab work & pertinent test results, reviewed documented beta blocker date and time   Airway Mallampati: III  TM Distance: >3 FB     Dental  (+) Chipped   Pulmonary           Cardiovascular hypertension, Pt. on medications and Pt. on home beta blockers      Neuro/Psych Anxiety CVA    GI/Hepatic   Endo/Other  diabetes, Type 2  Renal/GU Renal disease     Musculoskeletal   Abdominal   Peds  Hematology   Anesthesia Other Findings EKG ok.  Reproductive/Obstetrics                             Anesthesia Physical Anesthesia Plan  ASA: III  Anesthesia Plan: General   Post-op Pain Management:    Induction: Intravenous  PONV Risk Score and Plan:   Airway Management Planned: LMA  Additional Equipment:   Intra-op Plan:   Post-operative Plan:   Informed Consent: I have reviewed the patients History and Physical, chart, labs and discussed the procedure including the risks, benefits and alternatives for the proposed anesthesia with the patient or authorized representative who has indicated his/her understanding and acceptance.     Plan Discussed with: CRNA  Anesthesia Plan Comments:         Anesthesia Quick Evaluation

## 2018-03-11 LAB — BODY FLUID CULTURE
CULTURE: NO GROWTH
Gram Stain: NONE SEEN

## 2018-04-04 LAB — FUNGUS CULTURE WITH STAIN

## 2018-04-04 LAB — FUNGAL ORGANISM REFLEX

## 2018-04-04 LAB — FUNGUS CULTURE RESULT

## 2018-07-19 ENCOUNTER — Encounter: Payer: Self-pay | Admitting: Occupational Therapy

## 2018-07-19 ENCOUNTER — Other Ambulatory Visit: Payer: Self-pay

## 2018-07-19 ENCOUNTER — Ambulatory Visit: Payer: BC Managed Care – PPO | Attending: Orthopedic Surgery | Admitting: Occupational Therapy

## 2018-07-19 DIAGNOSIS — M25631 Stiffness of right wrist, not elsewhere classified: Secondary | ICD-10-CM

## 2018-07-19 DIAGNOSIS — M6281 Muscle weakness (generalized): Secondary | ICD-10-CM | POA: Diagnosis present

## 2018-07-19 DIAGNOSIS — M25632 Stiffness of left wrist, not elsewhere classified: Secondary | ICD-10-CM | POA: Diagnosis present

## 2018-07-19 DIAGNOSIS — M25642 Stiffness of left hand, not elsewhere classified: Secondary | ICD-10-CM

## 2018-07-19 DIAGNOSIS — M25532 Pain in left wrist: Secondary | ICD-10-CM | POA: Diagnosis not present

## 2018-07-19 DIAGNOSIS — M25641 Stiffness of right hand, not elsewhere classified: Secondary | ICD-10-CM | POA: Diagnosis present

## 2018-07-19 DIAGNOSIS — M25531 Pain in right wrist: Secondary | ICD-10-CM

## 2018-07-19 NOTE — Patient Instructions (Signed)
Contrast on bilateral forearm and wrist  Prior to AROM pain free - with slight pull  Wrist AROM in all planes pain free   2 x day 10 reps  Soft tissue massage by wife for volar and dorsal forearms  And CT spreads  And wear wrist splint on R with act that cause pain  And soft neoprene when on computer on R wrist   Use palms or forearm to carry or pick up objects  Avoid tight grip or pain on ulnar side of wrist

## 2018-07-19 NOTE — Therapy (Signed)
[facility] PHYS[facility]ORTS MEDICINE [address] [place], [address] Phone: [phone]   Fax:  [phone]  Occupational Therapy Evaluation  Patient Details  Name: Keith Reilly MRN: [id number] Date of Birth: December 01, 1965 Referring Provider (OT): Rosita Kea   Encounter Date: 07/19/2018  OT End of Session - 07/19/18 1839    Visit Number  1    Number of Visits  6    Date for OT Re-Evaluation  08/30/18    OT Start Time  0850    OT Stop Time  0951    OT Time Calculation (min)  61 min    Activity Tolerance  Patient tolerated treatment well    Behavior During Therapy  [facility] for tasks assessed/performed       Past Medical History:  Diagnosis Date  . Anxiety   . BPH (benign prostatic hypertrophy)   . Bronchitis, acute 08/08/15   completed zithromax.  feeling better  . Deviated nasal septum    nasal obstruction,s/p nasal fx yrs ago  . Diabetes mellitus without complication (HCC)    TYPE 2  . History of kidney stones   . Hyperlipemia   . Hypertension   . Insomnia   . Nasal turbinate hypertrophy   . Nephrolithiasis    KIDNEY STONES  . Scoliosis   . Seasonal allergies    sinus type symptoms  . Stroke [facility]) 2017   chronic arterial ischemic stroke. tia identified months after incident    Past Surgical History:  Procedure Laterality Date  . HERNIA REPAIR Right 1972   inguinal  . KNEE ARTHROSCOPY WITH MEDIAL MENISECTOMY Right 03/08/2018   Procedure: KNEE ARTHROSCOPY WITH PLICA EXCISION,SYNOVECTOMY CHONDROPLASTY, PARTIAL LATERAL MEDIAL MENISECTOMY;  Surgeon: Signa Kell, MD;  Location: [facility] ORS;  Service: Orthopedics;  Laterality: Right;  . KNEE SURGERY Right 1987   KNEE ARTHROSCOPY  . NASAL TURBINATE REDUCTION Left 08/26/2015   Procedure: LEFT PARTIAL TURBINATE REDUCTION/SUBMUCOSAL RESECTION;  Surgeon: Vernie Murders, MD;  Location: [facility];  Service: ENT;  Laterality: Left;  diabetic - oral meds  . SEPTOPLASTY N/A  08/26/2015   Procedure: SEPTOPLASTY;  Surgeon: Vernie Murders, MD;  Location: [facility];  Service: ENT;  Laterality: N/A;  . SEPTOPLASTY Bilateral 12/02/2015   Procedure: SEPTOPLASTY REVISION;  Surgeon: Vernie Murders, MD;  Location: [facility];  Service: ENT;  Laterality: Bilateral;  DIABETIC    There were no vitals filed for this visit.  Subjective Assessment - 07/19/18 1828    Subjective   My forearms and wrist pain is so bad - increase to about 10/10 on to bottom and sides - R worse than L - it has been going on since June - did some Jetskiing , and then had knee surgery in July and was week on crutches - but still painfull - since taking  Mobic  this Monday - pain is down to 3-4/10  but I it hurts to push door open at school , work on computer - lift anything like water bottle or milk , cannot play golf or greet people     Patient Stated Goals  Want to get the pain and mobility in my hands and forearms better so I can do my job, lift and carry objcect, play golf      Currently in Pain?  Yes    Pain Score  7     Pain Location  Wrist    Pain Orientation  Right;Left    Pain Descriptors / Indicators  Aching;Tightness    Pain Onset  More than a month ago    Pain Frequency  Constant        [facility] OT Assessment - 07/19/18 0001      Assessment   Medical Diagnosis  Bilateral forearm tendinitis     Referring Provider (OT)  Rosita KeaMenz    Onset Date/Surgical Date  01/26/18    Hand Dominance  Left;Right    Next MD Visit  --   4-6 wks from 18th Nov     Home  Environment   Lives With  Family      Prior Function   Vocation  Full time employment    Leisure  [employer] , like to play golf, do some yardwork and has 7 and 11 yrs old children       AROM   Right Forearm Pronation  85 Degrees    Right Forearm Supination  60 Degrees    Left Forearm Pronation  85 Degrees    Left Forearm Supination  55 Degrees    Right Wrist Extension  50 Degrees    Right Wrist Flexion  58  Degrees    Right Wrist Radial Deviation  15 Degrees    Right Wrist Ulnar Deviation  20 Degrees    Left Wrist Extension  48 Degrees    Left Wrist Flexion  63 Degrees    Left Wrist Radial Deviation  24 Degrees    Left Wrist Ulnar Deviation  20 Degrees      Strength   Right Hand Grip (lbs)  55    Right Hand Lateral Pinch  23 lbs    Right Hand 3 Point Pinch  18 lbs    Left Hand Grip (lbs)  31    Left Hand Lateral Pinch  24 lbs    Left Hand 3 Point Pinch  16 lbs      Right Hand AROM   R Thumb Opposition to Index  --   opposition to side of 5th      Left Hand AROM   L Thumb Opposition to Index  --   Opposition to side of 5th           Contrast done and soft tissue mobs - using Graston tool nr 2 and 4 for sweeping and brushing over volar and dorsal forearm and wrist   Review HEP and fit pt with correct splints     Contrast on bilateral forearm and wrist  Prior to AROM pain free - with slight pull  Wrist AROM in all planes pain free   2 x day 10 reps  Soft tissue massage by wife for volar and dorsal forearms  And CT spreads  And wear wrist splint on R with act that cause pain  And soft neoprene when on computer on R wrist   Use palms or forearm to carry or pick up objects  Avoid tight grip or pain on ulnar side of wrist            OT Education - 07/19/18 1839    Education Details  findings of eval and review splint wearing and HEP     Person(s) Educated  Patient    Methods  Explanation;Demonstration;Handout    Comprehension  Verbalized understanding;Returned demonstration       OT Short Term Goals - 07/19/18 1847      OT SHORT TERM GOAL #1   Title  Pain on PRWHE improve with more than 20 points  Baseline  at eval PRWHE pain score 42/50    Time  4    Period  Weeks    Status  New    Target Date  08/16/18      OT SHORT TERM GOAL #2   Title  Pt to be ind in HEP for splint wearing , soft tissue mobs and AROM to decrease pain and increase AROM at  bilateral wrist     Baseline  not knowledge of HEP     Time  2    Period  Weeks    Status  New    Target Date  08/02/18        OT Long Term Goals - 07/19/18 1848      OT LONG TERM GOAL #1   Title  Bilateral wrist AROM to be WFL to use hand in more than 50% of functional act on PRWHE without increase symptoms     Baseline  pt impaired in function 62% , and AROM limited in all planes - see flowsheet     Time  5    Period  Weeks    Status  New    Target Date  08/23/18      OT LONG TERM GOAL #2   Title  Strength in bilateral wrist improve to 4+/5 without increase symptoms to use hand in gripping , lifting , pushing door     Baseline  pain with UD , sup and wrist flexion - 7/10     Time  6    Period  Weeks    Status  New    Target Date  08/30/18      OT LONG TERM GOAL #3   Title  Pt's bilateral  grip and 3 point grip improve to in range for his age to return to prior level of function    Baseline  grip R 55, L 31lbs , 3 point grip R 18 , L 16     Time  6    Period  Weeks    Status  New    Target Date  08/30/18      OT LONG TERM GOAL #4   Title  Function score on PRWHE improve wiht more than 20 points     Baseline  at eval function score on PRWHE 31/50     Time  6    Period  Weeks    Status  New    Target Date  08/30/18            Plan - 07/19/18 1840    Clinical Impression Statement  Pt present with bilateral wrist pain on volar and ulnar wrist and some numbness in hands on ulnar side - pt negative for cubital tunnel , Guyons canal , lateral epicondylitis and DeQuervains - pt not tender on wrist with palpation - but very tight in all planes of AROM at bilateral wrist - and pain  with resistance to UD, wrist flexion and grip - pain with grasping , liifting , carrying and pushing objects - pain increase to  9-10/10 prior to Monday when he started Mobic - grip and 3 point grip is below his range of his age - pt limited in all ADL's and IADL's  - pt was fitted with prefab  wrist splint to wear with act to decrease pain and neoprene to use on computer      Occupational performance deficits (Please refer to evaluation for details):  ADL's;IADL's;Social Participation;Work;Play;Leisure    Rehab  Potential  Good    OT Frequency  1x / week    OT Duration  6 weeks    OT Treatment/Interventions  Self-care/ADL training;Therapeutic exercise;Iontophoresis;Paraffin;Fluidtherapy;Contrast Bath;Ultrasound;Manual Therapy;Passive range of motion;Splinting;Patient/family education    Plan  Assess progress with HEP     Clinical Decision Making  Multiple treatment options, significant modification of task necessary    OT Home Exercise Plan  see pt instruction     Consulted and Agree with Plan of Care  Patient       Patient will benefit from skilled therapeutic intervention in order to improve the following deficits and impairments:  Impaired flexibility, Pain, Impaired sensation, Decreased range of motion, Decreased strength, Impaired UE functional use  Visit Diagnosis: Pain in left wrist - Plan: Ot plan of care cert/re-cert  Pain in right wrist - Plan: Ot plan of care cert/re-cert  Stiffness of left hand, not elsewhere classified - Plan: Ot plan of care cert/re-cert  Stiffness of right wrist, not elsewhere classified - Plan: Ot plan of care cert/re-cert  Stiffness of left wrist, not elsewhere classified - Plan: Ot plan of care cert/re-cert  Stiffness of right hand, not elsewhere classified - Plan: Ot plan of care cert/re-cert  Muscle weakness (generalized) - Plan: Ot plan of care cert/re-cert    Problem List There are no active problems to display for this patient.   Oletta Cohn OTR/L,CLT 07/19/2018, 6:58 PM  [facility] PHYSICAL AND SPORTS MEDICINE [address] [place], [address] Phone: [phone]   Fax:  [phone]  Name: Keith Reilly MRN: [id number] Date of Birth: Apr 09, 1966

## 2018-07-24 ENCOUNTER — Ambulatory Visit: Payer: BC Managed Care – PPO | Admitting: Occupational Therapy

## 2018-07-24 DIAGNOSIS — M25631 Stiffness of right wrist, not elsewhere classified: Secondary | ICD-10-CM

## 2018-07-24 DIAGNOSIS — M25641 Stiffness of right hand, not elsewhere classified: Secondary | ICD-10-CM

## 2018-07-24 DIAGNOSIS — M6281 Muscle weakness (generalized): Secondary | ICD-10-CM

## 2018-07-24 DIAGNOSIS — M25632 Stiffness of left wrist, not elsewhere classified: Secondary | ICD-10-CM

## 2018-07-24 DIAGNOSIS — M25532 Pain in left wrist: Secondary | ICD-10-CM

## 2018-07-24 DIAGNOSIS — M25531 Pain in right wrist: Secondary | ICD-10-CM

## 2018-07-24 DIAGNOSIS — M25642 Stiffness of left hand, not elsewhere classified: Secondary | ICD-10-CM

## 2018-07-24 NOTE — Therapy (Signed)
[facility] [facility] PH[facility]IC[facility] [address] , [place], [address] Phone: [phone]   Fax:  [phone]  Occupational Therapy Treatment  Patient Details  Name: Keith JesterJeffrey Reilly MRN: [id number] Date of Birth: 06/29/1966 Referring Provider (OT): Rosita KeaMenz   Encounter Date: 07/24/2018  OT End of Session - 07/24/18 1714    Visit Number  2    Number of Visits  6    Date for OT Re-Evaluation  08/30/18    OT Start Time  1446    OT Stop Time  1530    OT Time Calculation (min)  44 min    Activity Tolerance  Patient tolerated treatment well    Behavior During Therapy  [facility] assessed/performed       Past Medical History:  Diagnosis Date  . Anxiety   . BPH (benign prostatic hypertrophy)   . Bronchitis, acute 08/08/15   completed zithromax.  feeling better  . Deviated nasal septum    nasal obstruction,s/p nasal fx yrs ago  . Diabetes mellitus without complication (HCC)    TYPE 2  . History of kidney stones   . Hyperlipemia   . Hypertension   . Insomnia   . Nasal turbinate hypertrophy   . Nephrolithiasis    KIDNEY STONES  . Scoliosis   . Seasonal allergies    sinus type symptoms  . Stroke [facility]) 2017   chronic arterial ischemic stroke. tia identified months after incident    Past Surgical History:  Procedure Laterality Date  . HERNIA REPAIR Right 1972   inguinal  . KNEE ARTHROSCOPY WITH MEDIAL MENISECTOMY Right 03/08/2018   Procedure: KNEE ARTHROSCOPY WITH PLICA EXCISION,SYNOVECTOMY CHONDROPLASTY, PARTIAL LATERAL MEDIAL MENISECTOMY;  Surgeon: Signa KellPatel, Sunny, MD;  Location: [facility];  Service: Orthopedics;  Laterality: Right;  . KNEE SURGERY Right 1987   KNEE ARTHROSCOPY  . NASAL TURBINATE REDUCTION Left 08/26/2015   Procedure: LEFT PARTIAL TURBINATE REDUCTION/SUBMUCOSAL RESECTION;  Surgeon: Vernie MurdersPaul Juengel, MD;  [facility];  Service: ENT;  Laterality: Left;  diabetic - oral meds  . SEPTOPLASTY N/A  08/26/2015   Procedure: SEPTOPLASTY;  Surgeon: Vernie MurdersPaul Juengel, MD;  [facility];  Service: ENT;  Laterality: N/A;  . SEPTOPLASTY Bilateral 12/02/2015   Procedure: SEPTOPLASTY REVISION;  Surgeon: Vernie MurdersPaul Juengel, MD;  [facility];  Service: ENT;  Laterality: Bilateral;  DIABETIC    There were no vitals filed for this visit.  Subjective Assessment - 07/24/18 1710    Subjective   Doing little better, more motion and no pain in my splint if using R hand - but if doing a wrong move -stil can be 10/10 - my wife helps wiht massage in evening and doing my range of motion in am and pm -     Patient Stated Goals  Want to get the pain and mobility in my hands and forearms better so I can do my job, lift and carry objcect, play golf      Currently in Pain?  No/denies   at rest         [facility] - 07/24/18 0001      AROM   Right Forearm Pronation  85 Degrees    Right Forearm Supination  70 Degrees    Left Forearm Pronation  85 Degrees    Left Forearm Supination  75 Degrees    Right Wrist Extension  55 Degrees    Right Wrist Flexion  74 Degrees  Right Wrist Radial Deviation  24 Degrees    Right Wrist Ulnar Deviation  30 Degrees    Left Wrist Extension  55 Degrees    Left Wrist Flexion  78 Degrees    Left Wrist Radial Deviation  20 Degrees    Left Wrist Ulnar Deviation  30 Degrees      Strength   Right Hand Grip (lbs)  65    Right Hand Lateral Pinch  30 lbs    Right Hand 3 Point Pinch  20 lbs    Left Hand Grip (lbs)  46    Left Hand Lateral Pinch  27 lbs    Left Hand 3 Point Pinch  16 lbs       Measurement taken and see flowssheet for progress -great progress since last week  wife assist pt with soft tissue massage          OT Treatments/Exercises (OP) - 07/24/18 0001      RUE Contrast Bath   Time  11 minutes    Comments  foreram to hand prior to soft tissue and stretches       LUE Contrast Bath   Time  11 minutes    Comments   forearm and hand - prior to soft tissue and stretches        after contrast  Done this date soft tissue mobs - using Graston tool nr 2 and 4 for sweeping and brushing over volar and dorsal forearm and wrist  - still tight and brawny   Pt to cont with wrist splint on R wrist with act that cause pain and Benik neoprene splint for computer   Cont  AROM pain free - with slight pull  Wrist AROM in all planes pain free   2 x day 10 reps Add this date stretches for wrist flexors - 5 reps hold 5 sec -and wrist extensors PROM stretch to L only 5 reps hold 5 sec   Soft tissue massage by wife for volar and dorsal forearms  To cont  And CT spreads  And wear wrist splint on R with act that cause pain  And soft neoprene when on computer on R wrist   Use palms or forearm to carry or pick up objects  Avoid tight grip or pain on ulnar side of wrist        OT Education - 07/24/18 1713    Education Details  findings of progress  and review splint wearing and HEP update    Person(s) Educated  Patient    Methods  Explanation;Demonstration;Handout    Comprehension  Verbalized understanding;Returned demonstration       OT Short Term Goals - 07/19/18 1847      OT SHORT TERM GOAL #1   Title  Pain on PRWHE improve with more than 20 points     Baseline  at eval PRWHE pain score 42/50    Time  4    Period  Weeks    Status  New    Target Date  08/16/18      OT SHORT TERM GOAL #2   Title  Pt to be ind in HEP for splint wearing , soft tissue mobs and AROM to decrease pain and increase AROM at bilateral wrist     Baseline  not knowledge of HEP     Time  2    Period  Weeks    Status  New    Target Date  08/02/18  OT Long Term Goals - 07/19/18 1848      OT LONG TERM GOAL #1   Title  Bilateral wrist AROM to be WFL to use hand in more than 50% of functional act on PRWHE without increase symptoms     Baseline  pt impaired in function 62% , and AROM limited in all planes - see  flowsheet     Time  5    Period  Weeks    Status  New    Target Date  08/23/18      OT LONG TERM GOAL #2   Title  Strength in bilateral wrist improve to 4+/5 without increase symptoms to use hand in gripping , lifting , pushing door     Baseline  pain with UD , sup and wrist flexion - 7/10     Time  6    Period  Weeks    Status  New    Target Date  08/30/18      OT LONG TERM GOAL #3   Title  Pt's bilateral  grip and 3 point grip improve to in range for his age to return to prior level of function    Baseline  grip R 55, L 31lbs , 3 point grip R 18 , L 16     Time  6    Period  Weeks    Status  New    Target Date  08/30/18      OT LONG TERM GOAL #4   Title  Function score on PRWHE improve wiht more than 20 points     Baseline  at eval function score on PRWHE 31/50     Time  6    Period  Weeks    Status  New    Target Date  08/30/18            Plan - 07/24/18 1715    Clinical Impression Statement   Pt seen this date about week out from eval - pt show  increase AROM in bilateral wrist in all planes - and increase grip and prehension strength and less pain with AROM - but cont to be greatly tight in flexors more than extensors and Rworse than L - add this date  passive stretches to bilateral forearm flexors - and cont splint wearing on R wrist     Occupational performance deficits (Please refer to evaluation for details):  ADL's;IADL's;Social Participation;Work;Play;Leisure    Rehab Potential  Good    OT Frequency  --   1-2 x wk   OT Duration  6 weeks    OT Treatment/Interventions  Self-care/ADL training;Therapeutic exercise;Iontophoresis;Paraffin;Fluidtherapy;Contrast Bath;Ultrasound;Manual Therapy;Passive range of motion;Splinting;Patient/family education    Plan  Assess progress with HEP  and upgrade as needed     Clinical Decision Making  Multiple treatment options, significant modification of task necessary    OT Home Exercise Plan  see pt instruction     Consulted  and Agree with Plan of Care  Patient       Patient will benefit from skilled therapeutic intervention in order to improve the following deficits and impairments:  Impaired flexibility, Pain, Impaired sensation, Decreased range of motion, Decreased strength, Impaired UE functional use  Visit Diagnosis: Pain in left wrist  Pain in right wrist  Stiffness of left hand, not elsewhere classified  Stiffness of right wrist, not elsewhere classified  Stiffness of left wrist, not elsewhere classified  Muscle weakness (generalized)  Stiffness of right hand, not elsewhere classified  Problem List There are no active problems to display for this patient.   Oletta Cohn OTR/L .CLT 07/24/2018, 5:19 PM  [facility] PHYSICAL AND SPORTS MEDICINE [address] [place], [address] Phone: [phone]   Fax:  [phone]  Name: Hildred Mollica MRN: [id number] Date of Birth: 1966-02-16

## 2018-07-24 NOTE — Patient Instructions (Signed)
Same HEP  But add this date PROM stretch for bilateral flexors of forearm with elbow to side  And L wrist extensors stretches

## 2018-08-01 ENCOUNTER — Ambulatory Visit: Payer: BC Managed Care – PPO | Attending: Orthopedic Surgery | Admitting: Occupational Therapy

## 2018-08-01 DIAGNOSIS — M25641 Stiffness of right hand, not elsewhere classified: Secondary | ICD-10-CM | POA: Diagnosis present

## 2018-08-01 DIAGNOSIS — M6281 Muscle weakness (generalized): Secondary | ICD-10-CM | POA: Diagnosis present

## 2018-08-01 DIAGNOSIS — M25631 Stiffness of right wrist, not elsewhere classified: Secondary | ICD-10-CM

## 2018-08-01 DIAGNOSIS — M25642 Stiffness of left hand, not elsewhere classified: Secondary | ICD-10-CM

## 2018-08-01 DIAGNOSIS — M25532 Pain in left wrist: Secondary | ICD-10-CM | POA: Diagnosis not present

## 2018-08-01 DIAGNOSIS — M25531 Pain in right wrist: Secondary | ICD-10-CM

## 2018-08-01 DIAGNOSIS — M25632 Stiffness of left wrist, not elsewhere classified: Secondary | ICD-10-CM | POA: Diagnosis present

## 2018-08-01 NOTE — Patient Instructions (Signed)
Pt to do PROM - gentle stretch to wrist flexion , extention - but painfree  cont AROM in all planes  to wear bilateral Benik neoprene splints for sup /pro  Cont with heat and contrast and massage

## 2018-08-01 NOTE — Therapy (Signed)
[facility] [address] [place], [address] Phone: [phone]   Fax:  [phone]  Occupational Therapy Treatment  Patient Details  Name: Keith Reilly MRN: [id number] Date of Birth: Jan 21, 1966 Referring Provider (OT): Rosita Kea   Encounter Date: 08/01/2018  OT End of Session - 08/01/18 1829    Visit Number  3    Number of Visits  6    Date for OT Re-Evaluation  08/30/18    OT Start Time  1435    OT Stop Time  1531    OT Time Calculation (min)  56 min    Activity Tolerance  Patient tolerated treatment well    Behavior During Therapy  [facility] for tasks assessed/performed       Past Medical History:  Diagnosis Date  . Anxiety   . BPH (benign prostatic hypertrophy)   . Bronchitis, acute 08/08/15   completed zithromax.  feeling better  . Deviated nasal septum    nasal obstruction,s/p nasal fx yrs ago  . Diabetes mellitus without complication (HCC)    TYPE 2  . History of kidney stones   . Hyperlipemia   . Hypertension   . Insomnia   . Nasal turbinate hypertrophy   . Nephrolithiasis    KIDNEY STONES  . Scoliosis   . Seasonal allergies    sinus type symptoms  . Stroke [facility]) 2017   chronic arterial ischemic stroke. tia identified months after incident    Past Surgical History:  Procedure Laterality Date  . HERNIA REPAIR Right 1972   inguinal  . KNEE ARTHROSCOPY WITH MEDIAL MENISECTOMY Right 03/08/2018   Procedure: KNEE ARTHROSCOPY WITH PLICA EXCISION,SYNOVECTOMY CHONDROPLASTY, PARTIAL LATERAL MEDIAL MENISECTOMY;  Surgeon: Signa Kell, MD;  Location: [facility] ORS;  Service: Orthopedics;  Laterality: Right;  . KNEE SURGERY Right 1987   KNEE ARTHROSCOPY  . NASAL TURBINATE REDUCTION Left 08/26/2015   Procedure: LEFT PARTIAL TURBINATE REDUCTION/SUBMUCOSAL RESECTION;  Surgeon: Vernie Murders, MD;  Location: [facility];  Service: ENT;  Laterality: Left;  diabetic - oral meds  . SEPTOPLASTY N/A  08/26/2015   Procedure: SEPTOPLASTY;  Surgeon: Vernie Murders, MD;  Location: [facility];  Service: ENT;  Laterality: N/A;  . SEPTOPLASTY Bilateral 12/02/2015   Procedure: SEPTOPLASTY REVISION;  Surgeon: Vernie Murders, MD;  Location: [facility];  Service: ENT;  Laterality: Bilateral;  DIABETIC    There were no vitals filed for this visit.  Subjective Assessment - 08/01/18 1824    Subjective   I think doing little better- not as tight, more range - but still pain when doing that down and twist movement with my wrist - both sides evenly - can still get up to 10/10 when happens     Patient Stated Goals  Want to get the pain and mobility in my hands and forearms better so I can do my job, lift and carry objcect, play golf      Currently in Pain?  No/denies   if just sitting or straight AROM         [facility] OT Assessment - 08/01/18 0001      AROM   Right Forearm Pronation  90 Degrees    Right Forearm Supination  75 Degrees    Left Forearm Pronation  90 Degrees    Left Forearm Supination  85 Degrees    Right Wrist Extension  58 Degrees    Right Wrist Flexion  65 Degrees    Right Wrist Radial Deviation  26 Degrees    Right Wrist Ulnar Deviation  30 Degrees    Left Wrist Extension  65 Degrees    Left Wrist Flexion  82 Degrees    Left Wrist Radial Deviation  20 Degrees    Left Wrist Ulnar Deviation  30 Degrees      Strength   Right Hand Grip (lbs)  65    Right Hand Lateral Pinch  30 lbs    Right Hand 3 Point Pinch  20 lbs    Left Hand Grip (lbs)  53    Left Hand Lateral Pinch  30 lbs    Left Hand 3 Point Pinch  19 lbs       AROM to bilateral wrist assess - see flowsheet  and grip /prehension strength- see flowsheet  Progress more in L than R  Soft tissue tightness improving in bilateral forearm   pt cont to have at times 10/10 pain on ulnar wrist with combination movement of flexion with ulnar  deviation  fitted with bilateral neoprene splint for wrist to wear with  functional tasks to decrease pain at ulnar wrist       OT Treatments/Exercises (OP) - 08/01/18 0001      RUE Contrast Bath   Time  11 minutes    Comments  forearm to hand prior to soft tissue       LUE Contrast Bath   Time  11 minutes    Comments  prior to soft tissue to forearm and hand        after contrast  Done this date soft tissue mobs again using Graston tool nr 2 and 4 for sweeping and brushing over volar and dorsal forearm and wrist  - still tight and brawny but improving     Cont  AROM pain free Wrist AROM in all planes pain free  2 x day 10 reps Assess and review - stretches for wrist flexors - 10 reps hold 3 sec on wall  Was able this date to do wrist extensor stretches to bilateral wrist - 10 reps  hold 3 sec   To cont with HEP and wife doing soft tissue mobs to bilateral forearm      OT Education - 08/01/18 1827    Education Details  upgrade PROM /stretches for wrist flexion , ext bilateral - neoprene splint for wrist wearing     Person(s) Educated  Patient    Methods  Explanation;Demonstration;Handout    Comprehension  Verbalized understanding;Returned demonstration       OT Short Term Goals - 07/19/18 1847      OT SHORT TERM GOAL #1   Title  Pain on PRWHE improve with more than 20 points     Baseline  at eval PRWHE pain score 42/50    Time  4    Period  Weeks    Status  New    Target Date  08/16/18      OT SHORT TERM GOAL #2   Title  Pt to be ind in HEP for splint wearing , soft tissue mobs and AROM to decrease pain and increase AROM at bilateral wrist     Baseline  not knowledge of HEP     Time  2    Period  Weeks    Status  New    Target Date  08/02/18        OT Long Term Goals - 07/19/18 1848      OT LONG TERM GOAL #1  Title  Bilateral wrist AROM to be [facility] to use hand in more than 50% of functional act on PRWHE without increase symptoms     Baseline  pt impaired in function 62% , and AROM limited in all planes - see flowsheet      Time  5    Period  Weeks    Status  New    Target Date  08/23/18      OT LONG TERM GOAL #2   Title  Strength in bilateral wrist improve to 4+/5 without increase symptoms to use hand in gripping , lifting , pushing door     Baseline  pain with UD , sup and wrist flexion - 7/10     Time  6    Period  Weeks    Status  New    Target Date  08/30/18      OT LONG TERM GOAL #3   Title  Pt's bilateral  grip and 3 point grip improve to in range for his age to return to prior level of function    Baseline  grip R 55, L 31lbs , 3 point grip R 18 , L 16     Time  6    Period  Weeks    Status  New    Target Date  08/30/18      OT LONG TERM GOAL #4   Title  Function score on PRWHE improve wiht more than 20 points     Baseline  at eval function score on PRWHE 31/50     Time  6    Period  Weeks    Status  New    Target Date  08/30/18            Plan - 08/01/18 1831    Clinical Impression Statement  Pt cont to make progress in ROM at bilateral wrist ROM - L more than R  , soft tissue tightness and pain improving but cont to have tightness in R more than L - and bilateral pain at ulnar wrist with combination AROM - pt fitted with bilateral wrist neoprene splint to use during day with activities that cause pain -     Occupational performance deficits (Please refer to evaluation for details):  ADL's;IADL's;Social Participation;Work;Play;Leisure    Rehab Potential  Good    OT Frequency  --   1-2 x wk   OT Duration  6 weeks    OT Treatment/Interventions  Self-care/ADL training;Therapeutic exercise;Iontophoresis;Paraffin;Fluidtherapy;Contrast Bath;Ultrasound;Manual Therapy;Passive range of motion;Splinting;Patient/family education    Plan  assess progress and increase to 2 x wk and focus on soft tissue     Clinical Decision Making  Multiple treatment options, significant modification of task necessary    OT Home Exercise Plan  see pt instruction     Consulted and Agree with Plan of Care   Patient       Patient will benefit from skilled therapeutic intervention in order to improve the following deficits and impairments:  Impaired flexibility, Pain, Impaired sensation, Decreased range of motion, Decreased strength, Impaired UE functional use  Visit Diagnosis: Pain in left wrist  Pain in right wrist  Stiffness of left hand, not elsewhere classified  Stiffness of right wrist, not elsewhere classified  Stiffness of left wrist, not elsewhere classified  Muscle weakness (generalized)  Stiffness of right hand, not elsewhere classified    Problem List There are no active problems to display for this patient.   Oletta Cohn OTR/L,CLT 08/01/2018, 6:40 PM  [facility] [address], [place], [address] Phone: [phone]   Fax:  [phone]  Name: Hendryx Ricke MRN: [id number] Date of Birth: [birth year]

## 2018-08-06 ENCOUNTER — Ambulatory Visit: Payer: BC Managed Care – PPO | Admitting: Occupational Therapy

## 2018-08-06 DIAGNOSIS — M25532 Pain in left wrist: Secondary | ICD-10-CM | POA: Diagnosis not present

## 2018-08-06 DIAGNOSIS — M25642 Stiffness of left hand, not elsewhere classified: Secondary | ICD-10-CM

## 2018-08-06 DIAGNOSIS — M25631 Stiffness of right wrist, not elsewhere classified: Secondary | ICD-10-CM

## 2018-08-06 DIAGNOSIS — M25632 Stiffness of left wrist, not elsewhere classified: Secondary | ICD-10-CM

## 2018-08-06 DIAGNOSIS — M25641 Stiffness of right hand, not elsewhere classified: Secondary | ICD-10-CM

## 2018-08-06 DIAGNOSIS — M6281 Muscle weakness (generalized): Secondary | ICD-10-CM

## 2018-08-06 DIAGNOSIS — M25531 Pain in right wrist: Secondary | ICD-10-CM

## 2018-08-06 NOTE — Patient Instructions (Signed)
Same HEP this date- PROM for wrist flexion and extention  And AROM for RD , UD - but thumb in fist with UD  Sup and pronation  to cont with   Wear with act that cause wrist pain - his Benik splint

## 2018-08-06 NOTE — Therapy (Signed)
[facility] [address] [place], [place], [address] Phone: [phone]   Fax:  [phone]  Occupational Therapy Treatment  Patient Details  Name: Keith JesterJeffrey Reilly MRN: [id number] Date of Birth: 02/23/1966 Referring Provider (OT): Rosita KeaMenz   Encounter Date: 08/06/2018  OT End of Session - 08/06/18 1656    Visit Number  4    Number of Visits  6    Date for OT Re-Evaluation  08/30/18    OT Start Time  0830    OT Stop Time  0921    OT Time Calculation (min)  51 min    Activity Tolerance  Patient tolerated treatment well    Behavior During Therapy  [facility] for tasks assessed/performed       Past Medical History:  Diagnosis Date  . Anxiety   . BPH (benign prostatic hypertrophy)   . Bronchitis, acute 08/08/15   completed zithromax.  feeling better  . Deviated nasal septum    nasal obstruction,s/p nasal fx yrs ago  . Diabetes mellitus without complication (HCC)    TYPE 2  . History of kidney stones   . Hyperlipemia   . Hypertension   . Insomnia   . Nasal turbinate hypertrophy   . Nephrolithiasis    KIDNEY STONES  . Scoliosis   . Seasonal allergies    sinus type symptoms  . Stroke [facility]) 2017   chronic arterial ischemic stroke. tia identified months after incident    Past Surgical History:  Procedure Laterality Date  . HERNIA REPAIR Right 1972   inguinal  . KNEE ARTHROSCOPY WITH MEDIAL MENISECTOMY Right 03/08/2018   Procedure: KNEE ARTHROSCOPY WITH PLICA EXCISION,SYNOVECTOMY CHONDROPLASTY, PARTIAL LATERAL MEDIAL MENISECTOMY;  Surgeon: Signa KellPatel, Sunny, MD;  Location: [facility] ORS;  Service: Orthopedics;  Laterality: Right;  . KNEE SURGERY Right 1987   KNEE ARTHROSCOPY  . NASAL TURBINATE REDUCTION Left 08/26/2015   Procedure: LEFT PARTIAL TURBINATE REDUCTION/SUBMUCOSAL RESECTION;  Surgeon: Vernie MurdersPaul Juengel, MD;  [facility];  Service: ENT;  Laterality: Left;  diabetic - oral meds  . SEPTOPLASTY N/A  08/26/2015   Procedure: SEPTOPLASTY;  Surgeon: Vernie MurdersPaul Juengel, MD;  [facility];  Service: ENT;  Laterality: N/A;  . SEPTOPLASTY Bilateral 12/02/2015   Procedure: SEPTOPLASTY REVISION;  Surgeon: Vernie MurdersPaul Juengel, MD;  [facility];  Service: ENT;  Laterality: Bilateral;  DIABETIC    There were no vitals filed for this visit.  Subjective Assessment - 08/06/18 0838    Subjective   Doing okay- still having pain with that one movement going down and rotation side ways with my wrist - did do my exercises and massage over the weekend - miss one day     Patient Stated Goals  Want to get the pain and mobility in my hands and forearms better so I can do my job, lift and carry objcect, play golf      Currently in Pain?  No/denies         [facility] Assessment - 08/06/18 0001      AROM   Right Forearm Supination  80 Degrees               OT Treatments/Exercises (OP) - 08/06/18 0001      RUE Fluidotherapy   Number Minutes Fluidotherapy  10 Minutes    RUE Fluidotherapy Location  Wrist    Comments  AROM for wrist in all planes       LUE Fluidotherapy   Number  Minutes Fluidotherapy  10 Minutes    LUE Fluidotherapy Location  Wrist    Comments  AROM for wrist in all planes prior        soft tissue mobs again using Graston tool nr 2 and 4 for sweeping and brushing over volar and dorsal forearm and wrist- still tight and brawnybut improving  Carpal and MC spreads done  webspace of thumb -and UD stretch with thumb in fist   BTE done CPM for bilateral wrist flexion and extention 200 sec  And repeat another time for R wrist  good progress afterwards  And less pain with wrist flexion with UD  3-4/10  During day at times 7-9/10 per pt   to cont to wear Benik neoprene wrist splint with act that cause pain         OT Education - 08/06/18 1656    Education Details  progress, HEP and splint wearing     Person(s) Educated  Patient    Methods   Explanation;Demonstration;Handout    Comprehension  Verbalized understanding;Returned demonstration       OT Short Term Goals - 07/19/18 1847      OT SHORT TERM GOAL #1   Title  Pain on PRWHE improve with more than 20 points     Baseline  at eval PRWHE pain score 42/50    Time  4    Period  Weeks    Status  New    Target Date  08/16/18      OT SHORT TERM GOAL #2   Title  Pt to be ind in HEP for splint wearing , soft tissue mobs and AROM to decrease pain and increase AROM at bilateral wrist     Baseline  not knowledge of HEP     Time  2    Period  Weeks    Status  New    Target Date  08/02/18        OT Long Term Goals - 07/19/18 1848      OT LONG TERM GOAL #1   Title  Bilateral wrist AROM to be WFL to use hand in more than 50% of functional act on PRWHE without increase symptoms     Baseline  pt impaired in function 62% , and AROM limited in all planes - see flowsheet     Time  5    Period  Weeks    Status  New    Target Date  08/23/18      OT LONG TERM GOAL #2   Title  Strength in bilateral wrist improve to 4+/5 without increase symptoms to use hand in gripping , lifting , pushing door     Baseline  pain with UD , sup and wrist flexion - 7/10     Time  6    Period  Weeks    Status  New    Target Date  08/30/18      OT LONG TERM GOAL #3   Title  Pt's bilateral  grip and 3 point grip improve to in range for his age to return to prior level of function    Baseline  grip R 55, L 31lbs , 3 point grip R 18 , L 16     Time  6    Period  Weeks    Status  New    Target Date  08/30/18      OT LONG TERM GOAL #4   Title  Function score on PRWHE  improve wiht more than 20 points     Baseline  at eval function score on PRWHE 31/50     Time  6    Period  Weeks    Status  New    Target Date  08/30/18            Plan - 08/06/18 1657    Clinical Impression Statement  Pt making progress slow but steady in AROM and PROM for wrist in all planes - R still more tight than  L -and sup and wrist extention worse than others - done this date CPM on BTE for wrist flexion and extnetion after soft tissue mobs - good progress- pain with flexion and UD was less     Occupational performance deficits (Please refer to evaluation for details):  ADL's;IADL's;Social Participation;Work;Play;Leisure    Rehab Potential  Good    OT Frequency  2x / week    OT Duration  4 weeks    OT Treatment/Interventions  Self-care/ADL training;Therapeutic exercise;Iontophoresis;Paraffin;Fluidtherapy;Contrast Bath;Ultrasound;Manual Therapy;Passive range of motion;Splinting;Patient/family education    Plan  assess progress and increase to 2 x wk and focus on soft tissue     Clinical Decision Making  Multiple treatment options, significant modification of task necessary    OT Home Exercise Plan  see pt instruction     Consulted and Agree with Plan of Care  Patient       Patient will benefit from skilled therapeutic intervention in order to improve the following deficits and impairments:  Impaired flexibility, Pain, Impaired sensation, Decreased range of motion, Decreased strength, Impaired UE functional use  Visit Diagnosis: Pain in left wrist  Pain in right wrist  Stiffness of left hand, not elsewhere classified  Stiffness of right wrist, not elsewhere classified  Stiffness of left wrist, not elsewhere classified  Muscle weakness (generalized)  Stiffness of right hand, not elsewhere classified    Problem List There are no active problems to display for this patient.   Oletta Cohn OTR/L,CLT 08/06/2018, 5:01 PM  [facility] SPORTS [facility]IC[facility] [address], [place], [address] Phone: [phone]   Fax:  [phone]  Name: Keith Reilly MRN: [id number] Date of Birth: 1965/11/20

## 2018-08-09 ENCOUNTER — Ambulatory Visit: Payer: BC Managed Care – PPO | Admitting: Occupational Therapy

## 2018-08-09 DIAGNOSIS — M25631 Stiffness of right wrist, not elsewhere classified: Secondary | ICD-10-CM

## 2018-08-09 DIAGNOSIS — M25532 Pain in left wrist: Secondary | ICD-10-CM

## 2018-08-09 DIAGNOSIS — M6281 Muscle weakness (generalized): Secondary | ICD-10-CM

## 2018-08-09 DIAGNOSIS — M25641 Stiffness of right hand, not elsewhere classified: Secondary | ICD-10-CM

## 2018-08-09 DIAGNOSIS — M25531 Pain in right wrist: Secondary | ICD-10-CM

## 2018-08-09 DIAGNOSIS — M25632 Stiffness of left wrist, not elsewhere classified: Secondary | ICD-10-CM

## 2018-08-09 DIAGNOSIS — M25642 Stiffness of left hand, not elsewhere classified: Secondary | ICD-10-CM

## 2018-08-09 NOTE — Therapy (Signed)
[facility] [address] [place], [place], [address] Phone: [phone]   Fax:  [phone]  Occupational Therapy Treatment  Patient Details  Name: Keith JesterJeffrey Reilly MRN: [id number] Date of Birth: 08/03/1966 Referring Provider (OT): Rosita KeaMenz   Encounter Date: 08/09/2018  OT End of Session - 08/09/18 1426    Visit Number  5    Number of Visits  6    Date for OT Re-Evaluation  08/30/18    OT Start Time  0906    OT Stop Time  1008    OT Time Calculation (min)  62 min    Activity Tolerance  Patient tolerated treatment well    Behavior During Therapy  [facility] for tasks assessed/performed       Past Medical History:  Diagnosis Date  . Anxiety   . BPH (benign prostatic hypertrophy)   . Bronchitis, acute 08/08/15   completed zithromax.  feeling better  . Deviated nasal septum    nasal obstruction,s/p nasal fx yrs ago  . Diabetes mellitus without complication (HCC)    TYPE 2  . History of kidney stones   . Hyperlipemia   . Hypertension   . Insomnia   . Nasal turbinate hypertrophy   . Nephrolithiasis    KIDNEY STONES  . Scoliosis   . Seasonal allergies    sinus type symptoms  . Stroke [facility](HCC) 2017   chronic arterial ischemic stroke. tia identified months after incident    Past Surgical History:  Procedure Laterality Date  . HERNIA REPAIR Right 1972   inguinal  . KNEE ARTHROSCOPY WITH MEDIAL MENISECTOMY Right 03/08/2018   Procedure: KNEE ARTHROSCOPY WITH PLICA EXCISION,SYNOVECTOMY CHONDROPLASTY, PARTIAL LATERAL MEDIAL MENISECTOMY;  Surgeon: Signa KellPatel, Sunny, MD;  Location: [facility] ORS;  Service: Orthopedics;  Laterality: Right;  . KNEE SURGERY Right 1987   KNEE ARTHROSCOPY  . NASAL TURBINATE REDUCTION Left 08/26/2015   Procedure: LEFT PARTIAL TURBINATE REDUCTION/SUBMUCOSAL RESECTION;  Surgeon: Vernie MurdersPaul Juengel, MD;  [facility];  Service: ENT;  Laterality: Left;  diabetic - oral meds  . SEPTOPLASTY N/A  08/26/2015   Procedure: SEPTOPLASTY;  Surgeon: Vernie MurdersPaul Juengel, MD;  [facility];  Service: ENT;  Laterality: N/A;  . SEPTOPLASTY Bilateral 12/02/2015   Procedure: SEPTOPLASTY REVISION;  Surgeon: Vernie MurdersPaul Juengel, MD;  [facility];  Service: ENT;  Laterality: Bilateral;  DIABETIC    There were no vitals filed for this visit.  Subjective Assessment - 08/09/18 1423    Subjective   I was sore after last time - but I can tell big difference in my forearms that they are not as tight or stiff - but pain at my wrist with certain twist still can increase to 7/10 at the R and L 4/10     Patient Stated Goals  Want to get the pain and mobility in my hands and forearms better so I can do my job, lift and carry objcect, play golf      Currently in Pain?  Yes    Pain Score  7     Pain Location  Wrist    Pain Orientation  Left;Right    Pain Descriptors / Indicators  Aching    Pain Type  Acute pain    Pain Onset  More than a month ago    Pain Frequency  Occasional         [facility] OT Assessment - 08/09/18 0001      AROM   Right Forearm  Supination  75 Degrees    Left Forearm Pronation  90 Degrees    Right Wrist Extension  60 Degrees    Right Wrist Flexion  66 Degrees    Right Wrist Radial Deviation  26 Degrees    Right Wrist Ulnar Deviation  30 Degrees    Left Wrist Extension  55 Degrees    Left Wrist Flexion  85 Degrees    Left Wrist Radial Deviation  20 Degrees    Left Wrist Ulnar Deviation  30 Degrees       assess AROM for bilateral wrist AROM - see flowsheet  report that he has pain with end range sup or UD with flexion -pain R 7/10 and 4/10 on L - at times  Less with use of Benik wrist wrap          OT Treatments/Exercises (OP) - 08/09/18 0001      Iontophoresis   Type of Iontophoresis  Dexamethasone    Location  ulnar wrist R and L     Dose  med patch , 2.0 current    Time  19      RUE Fluidotherapy   Number Minutes Fluidotherapy  10 Minutes     RUE Fluidotherapy Location  Wrist    Comments  AROM for wrist in all planes       LUE Fluidotherapy   Number Minutes Fluidotherapy  10 Minutes    LUE Fluidotherapy Location  Wrist    Comments  AROM for wrist in all planes        soft tissue mobsagainusing Graston tool nr 2 and 4 for sweeping and brushing over volar and dorsal forearm and wrist- still tight and brawnybut improving Carpal and MC spreads done  webspace of thumb -and UD stretch with thumb in fist  Done this date gentle traction to bilateral wrist with joint mobs for Carpals and for head for radius and ulna  tolerate well and no pain   BTE done CPM for R wrist flexion and extention 200 sec   good progress afterwards  And less pain with wrist flexion with UD  3-4/10  During day at times 7-9/10 per pt   to cont to wear Benik neoprene wrist splint with act that cause pain    Ionto done at end of session - skin check done- pt tolerate well   for HEP   PROM or stretch for wrist RD,UD ,flexion and extention over edge of table pain free - done in clinic and add to HEP  Gentle traction at wrist with joint mobs by wife 10 reps  each     OT Education - 08/09/18 1426    Education Details  progress, findings and changes to HEP     Person(s) Educated  Patient    Methods  Explanation;Demonstration;Handout    Comprehension  Verbalized understanding;Returned demonstration       OT Short Term Goals - 07/19/18 1847      OT SHORT TERM GOAL #1   Title  Pain on PRWHE improve with more than 20 points     Baseline  at eval PRWHE pain score 42/50    Time  4    Period  Weeks    Status  New    Target Date  08/16/18      OT SHORT TERM GOAL #2   Title  Pt to be ind in HEP for splint wearing , soft tissue mobs and AROM to decrease pain and increase AROM at bilateral  wrist     Baseline  not knowledge of HEP     Time  2    Period  Weeks    Status  New    Target Date  08/02/18        OT Long Term Goals - 07/19/18 1848       OT LONG TERM GOAL #1   Title  Bilateral wrist AROM to be WFL to use hand in more than 50% of functional act on PRWHE without increase symptoms     Baseline  pt impaired in function 62% , and AROM limited in all planes - see flowsheet     Time  5    Period  Weeks    Status  New    Target Date  08/23/18      OT LONG TERM GOAL #2   Title  Strength in bilateral wrist improve to 4+/5 without increase symptoms to use hand in gripping , lifting , pushing door     Baseline  pain with UD , sup and wrist flexion - 7/10     Time  6    Period  Weeks    Status  New    Target Date  08/30/18      OT LONG TERM GOAL #3   Title  Pt's bilateral  grip and 3 point grip improve to in range for his age to return to prior level of function    Baseline  grip R 55, L 31lbs , 3 point grip R 18 , L 16     Time  6    Period  Weeks    Status  New    Target Date  08/30/18      OT LONG TERM GOAL #4   Title  Function score on PRWHE improve wiht more than 20 points     Baseline  at eval function score on PRWHE 31/50     Time  6    Period  Weeks    Status  New    Target Date  08/30/18            Plan - 08/09/18 1429    Clinical Impression Statement  Pt cont to show decrease AROM and PROM in R wrist more than the L -and pain on ulnar side of wrist with sup end range or UD resistance but report pain decrease with use of Benik wrist  wraps - this date change his PROM stretches for wrist and done ionto with dexamethzone to bilateral ulnar wrist because of pain can increase to 7/10 at times     Occupational performance deficits (Please refer to evaluation for details):  ADL's;IADL's;Social Participation;Work;Play;Leisure    Rehab Potential  Good    OT Frequency  2x / week    OT Duration  4 weeks    OT Treatment/Interventions  Self-care/ADL training;Therapeutic exercise;Iontophoresis;Paraffin;Fluidtherapy;Contrast Bath;Ultrasound;Manual Therapy;Passive range of motion;Splinting;Patient/family education     Plan  assess progress and increase to 2 x wk and focus on soft tissue , if need 2nd session of ionto    Clinical Decision Making  Multiple treatment options, significant modification of task necessary    OT Home Exercise Plan  see pt instruction     Consulted and Agree with Plan of Care  Patient       Patient will benefit from skilled therapeutic intervention in order to improve the following deficits and impairments:  Impaired flexibility, Pain, Impaired sensation, Decreased range of motion, Decreased strength, Impaired UE functional  use  Visit Diagnosis: Pain in left wrist  Pain in right wrist  Stiffness of left hand, not elsewhere classified  Stiffness of right wrist, not elsewhere classified  Stiffness of left wrist, not elsewhere classified  Muscle weakness (generalized)  Stiffness of right hand, not elsewhere classified    Problem List There are no active problems to display for this patient.   Oletta Cohn OTR/L,CLT 08/09/2018, 2:32 PM  [facility] [address] [place], [address] Phone: [phone]   Fax:  [phone]  Name: Keith Reilly MRN: [id number] Date of Birth: 12-12-1965

## 2018-08-09 NOTE — Patient Instructions (Signed)
Same HEP as last time - but PROM or stretch for wrist RD,UD ,flexion and extention over edge of table pain free Gentle traction at wrist with joint mobs by wife 10 reps  each

## 2018-08-14 ENCOUNTER — Ambulatory Visit: Payer: BC Managed Care – PPO | Admitting: Occupational Therapy

## 2018-08-14 DIAGNOSIS — M25532 Pain in left wrist: Secondary | ICD-10-CM

## 2018-08-14 DIAGNOSIS — M6281 Muscle weakness (generalized): Secondary | ICD-10-CM

## 2018-08-14 DIAGNOSIS — M25531 Pain in right wrist: Secondary | ICD-10-CM

## 2018-08-14 DIAGNOSIS — M25641 Stiffness of right hand, not elsewhere classified: Secondary | ICD-10-CM

## 2018-08-14 DIAGNOSIS — M25632 Stiffness of left wrist, not elsewhere classified: Secondary | ICD-10-CM

## 2018-08-14 DIAGNOSIS — M25642 Stiffness of left hand, not elsewhere classified: Secondary | ICD-10-CM

## 2018-08-14 DIAGNOSIS — M25631 Stiffness of right wrist, not elsewhere classified: Secondary | ICD-10-CM

## 2018-08-14 NOTE — Therapy (Signed)
[facility] [address], [place], [address] Phone: [phone]   Fax:  [phone]  Occupational Therapy Treatment  Patient Details  Name: Keith Reilly MRN: [id number] Date of Birth: 30-Nov-1965 Referring Provider (OT): Rosita Kea   Encounter Date: 08/14/2018  OT End of Session - 08/14/18 1641    Visit Number  6    Number of Visits  18    Date for OT Re-Evaluation  09/25/18    OT Start Time  1553    OT Stop Time  1716    OT Time Calculation (min)  83 min    Activity Tolerance  Patient tolerated treatment well    Behavior During Therapy  [facility] for tasks assessed/performed       Past Medical History:  Diagnosis Date  . Anxiety   . BPH (benign prostatic hypertrophy)   . Bronchitis, acute 08/08/15   completed zithromax.  feeling better  . Deviated nasal septum    nasal obstruction,s/p nasal fx yrs ago  . Diabetes mellitus without complication (HCC)    TYPE 2  . History of kidney stones   . Hyperlipemia   . Hypertension   . Insomnia   . Nasal turbinate hypertrophy   . Nephrolithiasis    KIDNEY STONES  . Scoliosis   . Seasonal allergies    sinus type symptoms  . Stroke [facility]) 2017   chronic arterial ischemic stroke. tia identified months after incident    Past Surgical History:  Procedure Laterality Date  . HERNIA REPAIR Right 1972   inguinal  . KNEE ARTHROSCOPY WITH MEDIAL MENISECTOMY Right 03/08/2018   Procedure: KNEE ARTHROSCOPY WITH PLICA EXCISION,SYNOVECTOMY CHONDROPLASTY, PARTIAL LATERAL MEDIAL MENISECTOMY;  Surgeon: Signa Kell, MD;  Location: [facility];  Service: Orthopedics;  Laterality: Right;  . KNEE SURGERY Right 1987   KNEE ARTHROSCOPY  . NASAL TURBINATE REDUCTION Left 08/26/2015   Procedure: LEFT PARTIAL TURBINATE REDUCTION/SUBMUCOSAL RESECTION;  Surgeon: Vernie Murders, MD;  Location: [facility];  Service: ENT;  Laterality: Left;  diabetic - oral meds  . SEPTOPLASTY N/A  08/26/2015   Procedure: SEPTOPLASTY;  Surgeon: Vernie Murders, MD;  Location: [facility];  Service: ENT;  Laterality: N/A;  . SEPTOPLASTY Bilateral 12/02/2015   Procedure: SEPTOPLASTY REVISION;  Surgeon: Vernie Murders, MD;  Location: [facility];  Service: ENT;  Laterality: Bilateral;  DIABETIC    There were no vitals filed for this visit.  Subjective Assessment - 08/14/18 1631    Subjective   I am sore over the bottom of my forearms - I did cook on the grill at school yesterday about 100 chicken, corn - but pain on the side of wrist is better- not feeling it     Patient Stated Goals  Want to get the pain and mobility in my hands and forearms better so I can do my job, lift and carry objcect, play golf      Currently in Pain?  Yes    Pain Score  2    2-4/10 ulnar wrist    Pain Location  Wrist    Pain Orientation  Right;Left    Pain Descriptors / Indicators  Aching    Pain Type  Acute pain    Pain Onset  More than a month ago    Pain Frequency  Occasional         [facility] OT Assessment - 08/14/18 0001      AROM   Right Wrist Extension  66  Degrees    Right Wrist Flexion  70 Degrees    Left Wrist Extension  60 Degrees    Left Wrist Flexion  80 Degrees      Strength   Right Hand Grip (lbs)  65    Right Hand Lateral Pinch  28 lbs    Right Hand 3 Point Pinch  22 lbs    Left Hand Grip (lbs)  53    Left Hand Lateral Pinch  28 lbs    Left Hand 3 Point Pinch  20 lbs       [facility] , grip and prehension - pain and functional use on PRWHE - pain improve from 42 to 18/5o and function 31/50 to 11/50   cont to be tight in R wrist and forearm more than L  CPM done again 2 x 200 sec on R and 1 x 200 sec on L with progress in flexion and extention         OT Treatments/Exercises (OP) - 08/14/18 0001      RUE Fluidotherapy   Number Minutes Fluidotherapy  10 Minutes    RUE Fluidotherapy Location  --   hand to forearm   Comments  AROM to wrist        LUE Fluidotherapy   Number Minutes Fluidotherapy  10 Minutes    LUE Fluidotherapy Location  --   hand to forearm   Comments  AROM to wrist         soft tissue mobsagainusing Graston tool nr 2 and 4 for sweeping and brushing over volar and dorsal forearm and wrist- still tight and brawnybut improving Carpal and MC spreads done webspace of thumb -and UD stretch with thumb in fist  Done this date gentle traction to bilateral wrist with joint mobs for Carpals and for head for radius and ulna- wife to do some traction at home again   tolerate well and no pain    Ionto done again 2nd session -skin check done - no issues  med patch on bilateral ulnar wrist -at 2.0 current and 19 min  Pt to keep patch on for hour    OT Education - 08/14/18 1636    Education Details  progress , ionto -     Person(s) Educated  Patient    Methods  Explanation;Demonstration;Handout    Comprehension  Verbalized understanding;Returned demonstration       OT Short Term Goals - 08/14/18 1706      OT SHORT TERM GOAL #1   Title  Pain on PRWHE improve with more than 20 points     Baseline  at eval PRWHE pain score 42/50 and now 18/50    Status  Achieved      OT SHORT TERM GOAL #2   Title  Pt to be ind in HEP for splint wearing , soft tissue mobs and AROM to decrease pain and increase AROM at bilateral wrist     Status  Achieved        OT Long Term Goals - 08/14/18 1708      OT LONG TERM GOAL #1   Title  Bilateral wrist AROM to be WFL to use hand in more than 50% of functional act on PRWHE without increase symptoms     Baseline  Wrist AROM increase greatly but still decrease on the R more than L - function improve to 22%     Time  3    Period  Weeks  Status  On-going    Target Date  09/11/18      OT LONG TERM GOAL #2   Title  Strength in bilateral wrist improve to 4+/5 without increase symptoms to use hand in gripping , lifting , pushing door     Baseline  pain with UD , sup and wrist  flexion - 2-4/10  increase to 5'10 at the worse     Time  6    Status  On-going    Target Date  09/25/18      OT LONG TERM GOAL #3   Title  Pt's bilateral  grip and 3 point grip improve to in range for his age to return to prior level of function    Baseline  grip R 55, L 31lbs , and Now R 63 and L  55 lbs     Time  6    Period  Weeks    Status  On-going    Target Date  09/25/18      OT LONG TERM GOAL #4   Title  Function score on PRWHE improve wiht more than 20 points     Baseline  at eval function score on PRWHE 31/50  and now 11/50    Status  Achieved            Plan - 08/14/18 1700    Clinical Impression Statement  Pt made  excellent  progress in ROM , grip , functional use and pain since [facility] - but still more tight in R than L wrist  -  but pt over all is tight in shoulders and elbows- pain decrease to  2-4/10 at [facility] at  bilateral wrists - pt to hold off still on golf and wear neoprene splints with activities that bother his ulnar wrist - did start ionto last visit  - 2nd session today  - recommend cont OT     Occupational performance deficits (Please refer to evaluation for details):  ADL's;IADL's;Social Participation;Work;Play;Leisure    Rehab Potential  Good    OT Frequency  --   1-2 x wk   OT Duration  6 weeks    OT Treatment/Interventions  Self-care/ADL training;Therapeutic exercise;Iontophoresis;Paraffin;Fluidtherapy;Contrast Bath;Ultrasound;Manual Therapy;Passive range of motion;Splinting;Patient/family education    Plan  assess progress focus on soft tissue / ROM  , and 3rd  session of ionto    Clinical Decision Making  Multiple treatment options, significant modification of task necessary    OT Home Exercise Plan  see pt instruction     Consulted and Agree with Plan of Care  Patient       Patient will benefit from skilled therapeutic intervention in order to improve the following deficits and impairments:  Impaired flexibility, Pain, Impaired sensation, Decreased  range of motion, Decreased strength, Impaired UE functional use  Visit Diagnosis: Pain in left wrist - Plan: Ot plan of care cert/re-cert  Pain in right wrist - Plan: Ot plan of care cert/re-cert  Stiffness of left hand, not elsewhere classified - Plan: Ot plan of care cert/re-cert  Stiffness of right wrist, not elsewhere classified - Plan: Ot plan of care cert/re-cert  Stiffness of left wrist, not elsewhere classified - Plan: Ot plan of care cert/re-cert  Muscle weakness (generalized) - Plan: Ot plan of care cert/re-cert  Stiffness of right hand, not elsewhere classified - Plan: Ot plan of care cert/re-cert    Problem List There are no active problems to display for this patient.   Oletta Cohn OTR/L,CLT 08/14/2018, 5:26  PM  [place] [facility] [address] [place], [address] Phone: [phone]   Fax:  [phone]  Name: Keith Reilly MRN: [id number] Date of Birth: [birth year]

## 2018-08-22 ENCOUNTER — Ambulatory Visit: Payer: BC Managed Care – PPO | Admitting: Occupational Therapy

## 2018-08-22 ENCOUNTER — Other Ambulatory Visit: Payer: Self-pay | Admitting: Orthopedic Surgery

## 2018-08-22 DIAGNOSIS — M25532 Pain in left wrist: Secondary | ICD-10-CM | POA: Diagnosis not present

## 2018-08-22 DIAGNOSIS — M25531 Pain in right wrist: Secondary | ICD-10-CM

## 2018-08-22 DIAGNOSIS — M25631 Stiffness of right wrist, not elsewhere classified: Secondary | ICD-10-CM

## 2018-08-22 DIAGNOSIS — M25642 Stiffness of left hand, not elsewhere classified: Secondary | ICD-10-CM

## 2018-08-22 DIAGNOSIS — M25632 Stiffness of left wrist, not elsewhere classified: Secondary | ICD-10-CM

## 2018-08-22 DIAGNOSIS — M6281 Muscle weakness (generalized): Secondary | ICD-10-CM

## 2018-08-22 DIAGNOSIS — M25641 Stiffness of right hand, not elsewhere classified: Secondary | ICD-10-CM

## 2018-08-22 NOTE — Patient Instructions (Signed)
Same as past  

## 2018-08-22 NOTE — Therapy (Signed)
[facility]INE [address], [place], [address] Phone: [phone]   Fax:  [phone]  Occupational Therapy Treatment  Patient Details  Name: Keith Reilly MRN: [id number] Date of Birth: 12-04-1965 Referring Provider (OT): Rosita Kea   Encounter Date: 08/22/2018  OT End of Session - 08/22/18 1427    Visit Number  7    Number of Visits  18    Date for OT Re-Evaluation  09/25/18    OT Start Time  0938    OT Stop Time  1035    OT Time Calculation (min)  57 min    Activity Tolerance  Patient tolerated treatment well    Behavior During Therapy  [facility] for tasks assessed/performed       Past Medical History:  Diagnosis Date  . Anxiety   . BPH (benign prostatic hypertrophy)   . Bronchitis, acute 08/08/15   completed zithromax.  feeling better  . Deviated nasal septum    nasal obstruction,s/p nasal fx yrs ago  . Diabetes mellitus without complication (HCC)    TYPE 2  . History of kidney stones   . Hyperlipemia   . Hypertension   . Insomnia   . Nasal turbinate hypertrophy   . Nephrolithiasis    KIDNEY STONES  . Scoliosis   . Seasonal allergies    sinus type symptoms  . Stroke [facility]) 2017   chronic arterial ischemic stroke. tia identified months after incident    Past Surgical History:  Procedure Laterality Date  . HERNIA REPAIR Right 1972   inguinal  . KNEE ARTHROSCOPY WITH MEDIAL MENISECTOMY Right 03/08/2018   Procedure: KNEE ARTHROSCOPY WITH PLICA EXCISION,SYNOVECTOMY CHONDROPLASTY, PARTIAL LATERAL MEDIAL MENISECTOMY;  Surgeon: Signa Kell, MD;  Location: [facility] ORS;  Service: Orthopedics;  Laterality: Right;  . KNEE SURGERY Right 1987   KNEE ARTHROSCOPY  . NASAL TURBINATE REDUCTION Left 08/26/2015   Procedure: LEFT PARTIAL TURBINATE REDUCTION/SUBMUCOSAL RESECTION;  Surgeon: Vernie Murders, MD;  Location: [facility];  Service: ENT;  Laterality: Left;  diabetic - oral meds  . SEPTOPLASTY N/A  08/26/2015   Procedure: SEPTOPLASTY;  Surgeon: Vernie Murders, MD;  Location: [facility];  Service: ENT;  Laterality: N/A;  . SEPTOPLASTY Bilateral 12/02/2015   Procedure: SEPTOPLASTY REVISION;  Surgeon: Vernie Murders, MD;  Location: [facility];  Service: ENT;  Laterality: Bilateral;  DIABETIC    There were no vitals filed for this visit.  Subjective Assessment - 08/22/18 0954    Subjective   My L hand and forearm I had increase pain since Sat to about yesterday- pain was like 12/10 - doing yesterday and today better- but tighter in forearm and hand - pain on palm sidie of my wrist this time - and I cannot remember that I really anything to cause it - did wear my soft splints - did see the Rheumathologist - and put me on (PLAQUENIL- going back in 4 months - called Dr Rosita Kea for MRI for my wrist -    Patient Stated Goals  Want to get the pain and mobility in my hands and forearms better so I can do my job, lift and carry objcect, play golf      Currently in Pain?  Yes    Pain Score  4     Pain Location  Wrist    Pain Orientation  Right    Pain Descriptors / Indicators  Aching    Pain Type  Chronic pain  Pain Onset  More than a month ago    Pain Frequency  Intermittent         [facility] OT Assessment - 08/22/18 0001      AROM   Right Forearm Supination  65 Degrees    Left Forearm Supination  75 Degrees    Right Wrist Extension  48 Degrees    Right Wrist Flexion  65 Degrees    Right Wrist Radial Deviation  18 Degrees    Right Wrist Ulnar Deviation  30 Degrees    Left Wrist Extension  45 Degrees    Left Wrist Flexion  75 Degrees    Left Wrist Radial Deviation  18 Degrees    Left Wrist Ulnar Deviation  28 Degrees       Pt report increase pain and tightness in L forearm and hand /wrist since this past weekend - pt denies any activities that could cause it   did loose some AROM with sup , and wrist extention          OT Treatments/Exercises (OP) - 08/22/18 0001       Iontophoresis   Type of Iontophoresis  Dexamethasone    Location  ulnar wrist R and L     Dose  med patch , 2.0 current    Time  19      LUE Paraffin   Number Minutes Paraffin  10 Minutes    LUE Paraffin Location  Hand;Wrist;Forearm    Comments  prior  to soft tissue       RUE Fluidotherapy   Number Minutes Fluidotherapy  10 Minutes    RUE Fluidotherapy Location  Wrist    Comments  AROM for wrist in all planes         soft tissue mobs done using Graston tool nr 2 and 4 for sweeping and brushing over volar and dorsal forearm and wrist- more tight and brawny on L  Carpal and MC spreads done webspace of thumb -and UD stretch with thumb in fist  Done this date gentle traction to bilateral wrist with joint mobs for Carpals and for head for radius and ulna  tolerate well and no pain But had some pain this date on Ulnar and volar wrist on L - about 2-4/10    to cont to wear Benik neoprene wrist splint with act that cause pain  And now night time too - pt sleep with hands in fist and elbow flex  Ionto done at end of session - skin check done prior - pt tolerate well  Pt to keep patches on for about hour afterwards   for HEP   PROM or stretch for wrist RD,UD ,flexion and extention over edge of table pain free - done in clinic and add to HEP  Gentle traction at wrist with joint mobs by wife 10 reps  each        OT Education - 08/22/18 1426    Education Details  findings - ROM decrease - results of MD appt -and / MRI     Person(s) Educated  Patient    Methods  Explanation;Demonstration;Handout    Comprehension  Verbalized understanding;Returned demonstration       OT Short Term Goals - 08/14/18 1706      OT SHORT TERM GOAL #1   Title  Pain on PRWHE improve with more than 20 points     Baseline  at eval PRWHE pain score 42/50 and now 18/50    Status  Achieved  OT SHORT TERM GOAL #2   Title  Pt to be ind in HEP for splint wearing , soft tissue mobs and AROM to  decrease pain and increase AROM at bilateral wrist     Status  Achieved        OT Long Term Goals - 08/14/18 1708      OT LONG TERM GOAL #1   Title  Bilateral wrist AROM to be WFL to use hand in more than 50% of functional act on PRWHE without increase symptoms     Baseline  Wrist AROM increase greatly but still decrease on the R more than L - function improve to 22%     Time  3    Period  Weeks    Status  On-going    Target Date  09/11/18      OT LONG TERM GOAL #2   Title  Strength in bilateral wrist improve to 4+/5 without increase symptoms to use hand in gripping , lifting , pushing door     Baseline  pain with UD , sup and wrist flexion - 2-4/10  increase to 5'10 at the worse     Time  6    Status  On-going    Target Date  09/25/18      OT LONG TERM GOAL #3   Title  Pt's bilateral  grip and 3 point grip improve to in range for his age to return to prior level of function    Baseline  grip R 55, L 31lbs , and Now R 63 and L  55 lbs     Time  6    Period  Weeks    Status  On-going    Target Date  09/25/18      OT LONG TERM GOAL #4   Title  Function score on PRWHE improve wiht more than 20 points     Baseline  at eval function score on PRWHE 31/50  and now 11/50    Status  Achieved            Plan - 08/22/18 1427    Clinical Impression Statement  Pt was seen about 5 wks now - 7th session - pt was progress well in AROM , pain - but cont to have 2-4 /10 pain - but over the Christmas break report had episode of increase pain to 12/10 in L forearm and hand - cannot relate it to any tasks - pain decrease coming in to about 2-4 /10 - tender over L volar wrist and FCU  and R ulnar wrist - done 2nd session of Ionto this date - pt report that he things he has RCT or injury on L shoulder and pt is very tight in other joints - more stiffness and tightness in hands - pt did see Dr Renard Matter and started him on (PLAQUENIL - pt also left message for Dr Rosita Kea for MRI - pt pain and tightness  decrease in session     Occupational performance deficits (Please refer to evaluation for details):  ADL's;IADL's;Social Participation;Work;Play;Leisure    Rehab Potential  Good    OT Frequency  --   1-2 wk   OT Duration  6 weeks    OT Treatment/Interventions  Self-care/ADL training;Therapeutic exercise;Iontophoresis;Paraffin;Fluidtherapy;Contrast Bath;Ultrasound;Manual Therapy;Passive range of motion;Splinting;Patient/family education    Plan  assess progress focus on soft tissue / ROM  , and 3rd  session of ionto    Clinical Decision Making  Multiple treatment options, significant modification of  task necessary    OT Home Exercise Plan  see pt instruction     Consulted and Agree with Plan of Care  Patient       Patient will benefit from skilled therapeutic intervention in order to improve the following deficits and impairments:  Impaired flexibility, Pain, Impaired sensation, Decreased range of motion, Decreased strength, Impaired UE functional use  Visit Diagnosis: Pain in left wrist  Pain in right wrist  Stiffness of left hand, not elsewhere classified  Stiffness of right wrist, not elsewhere classified  Stiffness of left wrist, not elsewhere classified  Muscle weakness (generalized)  Stiffness of right hand, not elsewhere classified    Problem List There are no active problems to display for this patient.   Oletta CohnuPreez, Zissel Biederman OTR/L,CLT 08/22/2018, 5:25 PM  [facility] [address] [place], [place], [address] Phone: [phone]   Fax:  [phone]  Name: Keith Reilly MRN: [id number] Date of Birth: 11/18/1965

## 2018-08-26 ENCOUNTER — Ambulatory Visit: Payer: BC Managed Care – PPO | Admitting: Occupational Therapy

## 2018-08-26 DIAGNOSIS — M25531 Pain in right wrist: Secondary | ICD-10-CM

## 2018-08-26 DIAGNOSIS — M25532 Pain in left wrist: Secondary | ICD-10-CM

## 2018-08-26 DIAGNOSIS — M25631 Stiffness of right wrist, not elsewhere classified: Secondary | ICD-10-CM

## 2018-08-26 DIAGNOSIS — M25632 Stiffness of left wrist, not elsewhere classified: Secondary | ICD-10-CM

## 2018-08-26 DIAGNOSIS — M6281 Muscle weakness (generalized): Secondary | ICD-10-CM

## 2018-08-26 DIAGNOSIS — M25642 Stiffness of left hand, not elsewhere classified: Secondary | ICD-10-CM

## 2018-08-26 DIAGNOSIS — M25641 Stiffness of right hand, not elsewhere classified: Secondary | ICD-10-CM

## 2018-08-26 NOTE — Patient Instructions (Signed)
Same  But add to R elbow - heat , cross friction massage, wrist extensor stretches extended arm - 5  X 5 sec Ice massage 2 x day Use palms to pick up objects - avoid picking up objects palm down

## 2018-08-26 NOTE — Therapy (Signed)
[facility] [address] [place], [place], [address] Phone: [phone]   Fax:  [phone]  Occupational Therapy Treatment  Patient Details  Name: Keith JesterJeffrey Reilly MRN: [id number] Date of Birth: 02/22/1966 Referring Provider (OT): Rosita KeaMenz   Encounter Date: 08/26/2018  OT End of Session - 08/26/18 1224    Visit Number  8    Number of Visits  18    Date for OT Re-Evaluation  09/25/18    OT Start Time  1124    OT Stop Time  1228    OT Time Calculation (min)  64 min    Activity Tolerance  Patient tolerated treatment well    Behavior During Therapy  [facility] for tasks assessed/performed       Past Medical History:  Diagnosis Date  . Anxiety   . BPH (benign prostatic hypertrophy)   . Bronchitis, acute 08/08/15   completed zithromax.  feeling better  . Deviated nasal septum    nasal obstruction,s/p nasal fx yrs ago  . Diabetes mellitus without complication (HCC)    TYPE 2  . History of kidney stones   . Hyperlipemia   . Hypertension   . Insomnia   . Nasal turbinate hypertrophy   . Nephrolithiasis    KIDNEY STONES  . Scoliosis   . Seasonal allergies    sinus type symptoms  . Stroke [facility]) 2017   chronic arterial ischemic stroke. tia identified months after incident    Past Surgical History:  Procedure Laterality Date  . HERNIA REPAIR Right 1972   inguinal  . KNEE ARTHROSCOPY WITH MEDIAL MENISECTOMY Right 03/08/2018   Procedure: KNEE ARTHROSCOPY WITH PLICA EXCISION,SYNOVECTOMY CHONDROPLASTY, PARTIAL LATERAL MEDIAL MENISECTOMY;  Surgeon: Signa KellPatel, Sunny, MD;  Location: [facility] OR[facility];  Service: Orthopedics;  Laterality: Right;  . KNEE SURGERY Right 1987   KNEE ARTHROSCOPY  . NASAL TURBINATE REDUCTION Left 08/26/2015   Procedure: LEFT PARTIAL TURBINATE REDUCTION/SUBMUCOSAL RESECTION;  Surgeon: Vernie MurdersPaul Juengel, MD;  [facility];  Service: ENT;  Laterality: Left;  diabetic - oral meds  . SEPTOPLASTY N/A  08/26/2015   Procedure: SEPTOPLASTY;  Surgeon: Vernie MurdersPaul Juengel, MD;  [facility];  Service: ENT;  Laterality: N/A;  . SEPTOPLASTY Bilateral 12/02/2015   Procedure: SEPTOPLASTY REVISION;  Surgeon: Vernie MurdersPaul Juengel, MD;  [facility];  Service: ENT;  Laterality: Bilateral;  DIABETIC    There were no vitals filed for this visit.  Subjective Assessment - 08/26/18 1222    Subjective   Since last time your said -wrist and hands felt great - but L elbow and forearm felt like tendinitis - pain lifting , bending of elbow and rotation of wrist - about 8/10     Patient Stated Goals  Want to get the pain and mobility in my hands and forearms better so I can do my job, lift and carry objcect, play golf      Currently in Pain?  Yes    Pain Score  8     Pain Location  Elbow    Pain Orientation  Left    Pain Descriptors / Indicators  Aching    Pain Type  Acute pain         [facility] OT Assessment - 08/26/18 0001      Strength   Right Hand Grip (lbs)  60   55   Left Hand Grip (lbs)  35   40     Pt arrive with no complains of pain  in bilateral wrist - AROM no pain  And resistance to wrist in all planes - 4+/5 and no pain  Still tender with palpation on ulnar side of wrist  But report pain on L elbow - lateral epicondyle- tender and pain  But no pain with 3rd digit and wrist extention resistance  Pain with picking up objects with palm down But if pressure over extensor mass - less pain  Pt ed on picking up objects with palm down         skin check done prior and pt to hold on patches for hour  OT Treatments/Exercises (OP) - 08/26/18 0001      Iontophoresis   Type of Iontophoresis  Dexamethasone    Dose  med patch , 2.0 current    Time  19      RUE Paraffin   Number Minutes Paraffin  10 Minutes    RUE Paraffin Location  Hand   wrist   Comments  prior to soft tissue to decrease pain       LUE Paraffin   Number Minutes Paraffin  10 Minutes    LUE Paraffin  Location  Forearm;Wrist;Hand   elbow   Comments  prior to soft itssue mobs to decrease pain        soft tissue mobs done using Graston tool nr 2 and 4 for sweeping and brushing over volar and dorsal forearm and wrist-improve greatly since last time  Done graston and cross friction massage over lateral epicondyle on the L elbow Carpal and MC spreads done webspace of thumb -and UD stretch with thumb in fist  Also Gentle PROM for L forearm extensors - 5 x 5 sec hold  Ice massage 1-2 min   Same HEP  But add to R elbow - heat , cross friction massage, wrist extensor stretches extended arm - 5  X 5 sec Ice massage 2 x day Use palms to pick up objects - avoid picking up objects palm down         OT Education - 08/26/18 1224    Education Details  change of HEP and findings of lateral epicondylitis    Person(s) Educated  Patient    Methods  Explanation;Demonstration;Handout    Comprehension  Verbalized understanding;Returned demonstration       OT Short Term Goals - 08/14/18 1706      OT SHORT TERM GOAL #1   Title  Pain on PRWHE improve with more than 20 points     Baseline  at eval PRWHE pain score 42/50 and now 18/50    Status  Achieved      OT SHORT TERM GOAL #2   Title  Pt to be ind in HEP for splint wearing , soft tissue mobs and AROM to decrease pain and increase AROM at bilateral wrist     Status  Achieved        OT Long Term Goals - 08/14/18 1708      OT LONG TERM GOAL #1   Title  Bilateral wrist AROM to be WFL to use hand in more than 50% of functional act on PRWHE without increase symptoms     Baseline  Wrist AROM increase greatly but still decrease on the R more than L - function improve to 22%     Time  3    Period  Weeks    Status  On-going    Target Date  09/11/18      OT LONG TERM GOAL #2  Title  Strength in bilateral wrist improve to 4+/5 without increase symptoms to use hand in gripping , lifting , pushing door     Baseline  pain with UD , sup  and wrist flexion - 2-4/10  increase to 5'10 at the worse     Time  6    Status  On-going    Target Date  09/25/18      OT LONG TERM GOAL #3   Title  Pt's bilateral  grip and 3 point grip improve to in range for his age to return to prior level of function    Baseline  grip R 55, L 31lbs , and Now R 63 and L  55 lbs     Time  6    Period  Weeks    Status  On-going    Target Date  09/25/18      OT LONG TERM GOAL #4   Title  Function score on PRWHE improve wiht more than 20 points     Baseline  at eval function score on PRWHE 31/50  and now 11/50    Status  Achieved            Plan - 08/26/18 1225    Clinical Impression Statement  Pt seen now 8th sessions - arrive this date with no pain reported with AROM to bilateral wrist - hands do not feel as tight or swollen than last time - but report increase pain in L elbow - lateral epicondyle - tender - but negative for resistance to wrist and 3rd digit extention - pain with supination - decrease grip - but no increase pain - pt is on Plaquenil since 25th dec -  pt schedule for MRI for wrists tomorrow -     Occupational performance deficits (Please refer to evaluation for details):  ADL's;IADL's;Social Participation;Work;Play;Leisure    Rehab Potential  Good    OT Frequency  --   1-2x wks   OT Duration  6 weeks    OT Treatment/Interventions  Self-care/ADL training;Therapeutic exercise;Iontophoresis;Paraffin;Fluidtherapy;Contrast Bath;Ultrasound;Manual Therapy;Passive range of motion;Splinting;Patient/family education    Plan  Results of MRI for wrists, elbow pain on L ?  , and  4th   session of ionto    Clinical Decision Making  Multiple treatment options, significant modification of task necessary    OT Home Exercise Plan  see pt instruction     Consulted and Agree with Plan of Care  Patient       Patient will benefit from skilled therapeutic intervention in order to improve the following deficits and impairments:  Impaired  flexibility, Pain, Impaired sensation, Decreased range of motion, Decreased strength, Impaired UE functional use  Visit Diagnosis: Pain in left wrist  Pain in right wrist  Stiffness of left hand, not elsewhere classified  Stiffness of right wrist, not elsewhere classified  Stiffness of left wrist, not elsewhere classified  Muscle weakness (generalized)  Stiffness of right hand, not elsewhere classified    Problem List There are no active problems to display for this patient.   Oletta Cohn OTR/L,CLT 08/26/2018, 12:52 PM  [facility] PHYSICAL AND SPORTS MEDICINE [address], [place], [address] Phone: [phone]   Fax:  [phone]  Name: Keith Reilly MRN: [id number] Date of Birth: 1966/03/17

## 2018-08-27 ENCOUNTER — Ambulatory Visit (HOSPITAL_COMMUNITY)
Admission: RE | Admit: 2018-08-27 | Discharge: 2018-08-27 | Disposition: A | Discharge status: Home / Self Care | Payer: BC Managed Care – PPO | Source: Ambulatory Visit | Attending: Orthopedic Surgery | Admitting: Orthopedic Surgery

## 2018-08-27 DIAGNOSIS — M25531 Pain in right wrist: Secondary | ICD-10-CM | POA: Insufficient documentation

## 2018-08-27 DIAGNOSIS — M25532 Pain in left wrist: Secondary | ICD-10-CM | POA: Insufficient documentation

## 2018-08-30 ENCOUNTER — Ambulatory Visit: Payer: BC Managed Care – PPO | Admitting: Occupational Therapy

## 2018-09-03 ENCOUNTER — Ambulatory Visit: Payer: BC Managed Care – PPO | Attending: Orthopedic Surgery | Admitting: Occupational Therapy

## 2018-09-03 DIAGNOSIS — M25632 Stiffness of left wrist, not elsewhere classified: Secondary | ICD-10-CM | POA: Diagnosis present

## 2018-09-03 DIAGNOSIS — M25642 Stiffness of left hand, not elsewhere classified: Secondary | ICD-10-CM

## 2018-09-03 DIAGNOSIS — M25631 Stiffness of right wrist, not elsewhere classified: Secondary | ICD-10-CM | POA: Diagnosis present

## 2018-09-03 DIAGNOSIS — M6281 Muscle weakness (generalized): Secondary | ICD-10-CM | POA: Diagnosis present

## 2018-09-03 DIAGNOSIS — M25531 Pain in right wrist: Secondary | ICD-10-CM | POA: Diagnosis present

## 2018-09-03 DIAGNOSIS — M25641 Stiffness of right hand, not elsewhere classified: Secondary | ICD-10-CM | POA: Diagnosis present

## 2018-09-03 DIAGNOSIS — M25532 Pain in left wrist: Secondary | ICD-10-CM | POA: Diagnosis present

## 2018-09-03 NOTE — Therapy (Signed)
[place] [facility] [address] [place], [address] Phone: [phone]   Fax:  [phone]  Occupational Therapy Treatment  Patient Details  Name: Keith Reilly MRN: [id number] Date of Birth: 08/18/1966 Referring Provider (OT): Rosita Kea   Encounter Date: 09/03/2018  OT End of Session - 09/03/18 1126    Visit Number  9    Number of Visits  18    Date for OT Re-Evaluation  09/25/18    OT Start Time  0810    OT Stop Time  0905    OT Time Calculation (min)  55 min    Activity Tolerance  Patient tolerated treatment well    Behavior During Therapy  [facility] for tasks assessed/performed       Past Medical History:  Diagnosis Date  . Anxiety   . BPH (benign prostatic hypertrophy)   . Bronchitis, acute 08/08/15   completed zithromax.  feeling better  . Deviated nasal septum    nasal obstruction,s/p nasal fx yrs ago  . Diabetes mellitus without complication (HCC)    TYPE 2  . History of kidney stones   . Hyperlipemia   . Hypertension   . Insomnia   . Nasal turbinate hypertrophy   . Nephrolithiasis    KIDNEY STONES  . Scoliosis   . Seasonal allergies    sinus type symptoms  . Stroke [facility]) 2017   chronic arterial ischemic stroke. tia identified months after incident    Past Surgical History:  Procedure Laterality Date  . HERNIA REPAIR Right 1972   inguinal  . KNEE ARTHROSCOPY WITH MEDIAL MENISECTOMY Right 03/08/2018   Procedure: KNEE ARTHROSCOPY WITH PLICA EXCISION,SYNOVECTOMY CHONDROPLASTY, PARTIAL LATERAL MEDIAL MENISECTOMY;  Surgeon: Signa Kell, MD;  Location: [facility] ORS;  Service: Orthopedics;  Laterality: Right;  . KNEE SURGERY Right 1987   KNEE ARTHROSCOPY  . NASAL TURBINATE REDUCTION Left 08/26/2015   Procedure: LEFT PARTIAL TURBINATE REDUCTION/SUBMUCOSAL RESECTION;  Surgeon: Vernie Murders, MD;  Location: [facility];  Service: ENT;  Laterality: Left;  diabetic - oral meds  . SEPTOPLASTY N/A  08/26/2015   Procedure: SEPTOPLASTY;  Surgeon: Vernie Murders, MD;  Location: [facility];  Service: ENT;  Laterality: N/A;  . SEPTOPLASTY Bilateral 12/02/2015   Procedure: SEPTOPLASTY REVISION;  Surgeon: Vernie Murders, MD;  Location: [facility] SUR[facility];  Service: ENT;  Laterality: Bilateral;  DIABETIC    There were no vitals filed for this visit.  Subjective Assessment - 09/03/18 0819    Subjective   My wrist feels much better as well as my L elbow -  using it more - but not pushing it - the MRI showed tenosynovitis at wrists - Dr Rosita Kea referring me to Rheumathologist     Patient Stated Goals  Want to get the pain and mobility in my hands and forearms better so I can do my job, lift and carry objcect, play golf      Currently in Pain?  Yes    Pain Score  --   4-7   Pain Location  Wrist    Pain Orientation  Left    Pain Descriptors / Indicators  Tender    Pain Onset  More than a month ago    Aggravating Factors   palpations ulnar wrist -and end range resistance to  Flexion of wrist          [facility] OT Assessment - 09/03/18 0001      AROM   Right Wrist Extension  55  Degrees    Right Wrist Flexion  73 Degrees    Right Wrist Radial Deviation  20 Degrees    Right Wrist Ulnar Deviation  28 Degrees    Left Wrist Extension  58 Degrees    Left Wrist Flexion  75 Degrees    Left Wrist Radial Deviation  20 Degrees      Strength   Right Hand Grip (lbs)  55   extended 61   Left Hand Grip (lbs)  55   extended 60     AROM for bilateral wrist improve some more - and grip strength increase since last time  AROM without pain - but tender over bilateral FCU -and pain at end range resistance for flexion of wrist   Pt cont to be stiff in hands , wrist , elbow and shoulders   Pt is since Dec 25th - on Plaquenil  Pt had MRI last week - showing bilateral wrist tenosynovitis  Refer to Rheumatology            OT Treatments/Exercises (OP) - 09/03/18 0001      Iontophoresis   Type  of Iontophoresis  Dexamethasone    Location  ulnar wrist R and L     Dose  med patch , 2.0 current    Time  19      RUE Paraffin   Number Minutes Paraffin  10 Minutes    RUE Paraffin Location  Wrist   hand   Comments  prior to soft tissue mobs       LUE Paraffin   Number Minutes Paraffin  10 Minutes    LUE Paraffin Location  --   wrist and hand    Comments  prior to soft tissue mobs        soft tissue mobsagainusing Graston tool nr 2 and 4 for sweeping and brushing over volar and dorsal forearm and wrist- still tight and brawnybut improving Carpal and MC spreads done webspace of thumb -and UD stretch   Skin check done prior to ionto -and pt to keep on for hour afterwards        OT Education - 09/03/18 1126    Education Details  findings and progress - HEP     Person(s) Educated  Patient    Methods  Explanation;Demonstration;Handout    Comprehension  Verbalized understanding;Returned demonstration       OT Short Term Goals - 08/14/18 1706      OT SHORT TERM GOAL #1   Title  Pain on PRWHE improve with more than 20 points     Baseline  at eval PRWHE pain score 42/50 and now 18/50    Status  Achieved      OT SHORT TERM GOAL #2   Title  Pt to be ind in HEP for splint wearing , soft tissue mobs and AROM to decrease pain and increase AROM at bilateral wrist     Status  Achieved        OT Long Term Goals - 08/14/18 1708      OT LONG TERM GOAL #1   Title  Bilateral wrist AROM to be WFL to use hand in more than 50% of functional act on PRWHE without increase symptoms     Baseline  Wrist AROM increase greatly but still decrease on the R more than L - function improve to 22%     Time  3    Period  Weeks    Status  On-going  Target Date  09/11/18      OT LONG TERM GOAL #2   Title  Strength in bilateral wrist improve to 4+/5 without increase symptoms to use hand in gripping , lifting , pushing door     Baseline  pain with UD , sup and wrist flexion - 2-4/10   increase to 5'10 at the worse     Time  6    Status  On-going    Target Date  09/25/18      OT LONG TERM GOAL #3   Title  Pt's bilateral  grip and 3 point grip improve to in range for his age to return to prior level of function    Baseline  grip R 55, L 31lbs , and Now R 63 and L  55 lbs     Time  6    Period  Weeks    Status  On-going    Target Date  09/25/18      OT LONG TERM GOAL #4   Title  Function score on PRWHE improve wiht more than 20 points     Baseline  at eval function score on PRWHE 31/50  and now 11/50    Status  Achieved            Plan - 09/03/18 1127    Clinical Impression Statement  Pt seen for 9 sessions -  and done ionto 5 sessions over FCU and pt arrive decrease pain at rest , AROM and functional use of bilateral wrist and hand - pt lateral epicondylitis on L improve  - [facility] but cont to be really stiff an tight in hands , wrists , elbow and shoulders - pt still tender at FCU L 7/10 and r 4/10 - and pain with resistance at end range - MRI showed tenosynovitis - Dr Rosita KeaMenz refer him to Rheumathologist - pt is since Dec 25the on Plaquenil - cont to decrease symptoms     Occupational performance deficits (Please refer to evaluation for details):  ADL's;IADL's;Social Participation;Work;Play;Leisure    Rehab Potential  Good    OT Frequency  --   1-2 x wks   OT Duration  4 weeks    OT Treatment/Interventions  Self-care/ADL training;Therapeutic exercise;Iontophoresis;Paraffin;Fluidtherapy;Contrast Bath;Ultrasound;Manual Therapy;Passive range of motion;Splinting;Patient/family education    Plan  assess progress- appt with Dr Renard MatterBock, - do PRWHE     Clinical Decision Making  Multiple treatment options, significant modification of task necessary    OT Home Exercise Plan  see pt instruction     Consulted and Agree with Plan of Care  Patient       Patient will benefit from skilled therapeutic intervention in order to improve the following deficits  and impairments:  Impaired flexibility, Pain, Impaired sensation, Decreased range of motion, Decreased strength, Impaired UE functional use  Visit Diagnosis: Pain in left wrist  Pain in right wrist  Stiffness of left hand, not elsewhere classified  Stiffness of right wrist, not elsewhere classified  Stiffness of left wrist, not elsewhere classified  Muscle weakness (generalized)  Stiffness of right hand, not elsewhere classified    Problem List There are no active problems to display for this patient.   Oletta CohnuPreez, Karishma Unrein OTR/L,CLT 09/03/2018, 11:57 AM  [facility] [address] [place], [place], [address] Phone: [phone]   Fax:  [phone]  Name: Keith Reilly MRN: [id number] Date of Birth: 06/18/1966

## 2018-09-03 NOTE — Patient Instructions (Signed)
Same - soft tissue to forearms  Stretches -and  HEP for L lateral epicondyle  Soft neoprene splint for bilateral wrist as needed

## 2018-09-13 ENCOUNTER — Ambulatory Visit: Payer: BC Managed Care – PPO | Admitting: Occupational Therapy

## 2018-09-13 DIAGNOSIS — M25641 Stiffness of right hand, not elsewhere classified: Secondary | ICD-10-CM

## 2018-09-13 DIAGNOSIS — M6281 Muscle weakness (generalized): Secondary | ICD-10-CM

## 2018-09-13 DIAGNOSIS — M25631 Stiffness of right wrist, not elsewhere classified: Secondary | ICD-10-CM

## 2018-09-13 DIAGNOSIS — M25532 Pain in left wrist: Secondary | ICD-10-CM | POA: Diagnosis not present

## 2018-09-13 DIAGNOSIS — M25531 Pain in right wrist: Secondary | ICD-10-CM

## 2018-09-13 DIAGNOSIS — M25642 Stiffness of left hand, not elsewhere classified: Secondary | ICD-10-CM

## 2018-09-13 DIAGNOSIS — M25632 Stiffness of left wrist, not elsewhere classified: Secondary | ICD-10-CM

## 2018-09-13 NOTE — Therapy (Signed)
[place] [facility] [address] [place], [address] Phone: [phone]   Fax:  [phone]  Occupational Therapy Treatment  Patient Details  Name: Keith Reilly MRN: [id number] Date of Birth: Jul 22, 1966 Referring Provider (OT): Rosita Kea   Encounter Date: 09/13/2018  OT End of Session - 09/13/18 1047    Visit Number  10    Number of Visits  18    Date for OT Re-Evaluation  09/25/18    OT Start Time  0815    OT Stop Time  0921    OT Time Calculation (min)  66 min    Activity Tolerance  Patient tolerated treatment well    Behavior During Therapy  [facility] for tasks assessed/performed       Past Medical History:  Diagnosis Date  . Anxiety   . BPH (benign prostatic hypertrophy)   . Bronchitis, acute 08/08/15   completed zithromax.  feeling better  . Deviated nasal septum    nasal obstruction,s/p nasal fx yrs ago  . Diabetes mellitus without complication (HCC)    TYPE 2  . History of kidney stones   . Hyperlipemia   . Hypertension   . Insomnia   . Nasal turbinate hypertrophy   . Nephrolithiasis    KIDNEY STONES  . Scoliosis   . Seasonal allergies    sinus type symptoms  . Stroke [facility]) 2017   chronic arterial ischemic stroke. tia identified months after incident    Past Surgical History:  Procedure Laterality Date  . HERNIA REPAIR Right 1972   inguinal  . KNEE ARTHROSCOPY WITH MEDIAL MENISECTOMY Right 03/08/2018   Procedure: KNEE ARTHROSCOPY WITH PLICA EXCISION,SYNOVECTOMY CHONDROPLASTY, PARTIAL LATERAL MEDIAL MENISECTOMY;  Surgeon: Signa Kell, MD;  Location: [facility] ORS;  Service: Orthopedics;  Laterality: Right;  . KNEE SURGERY Right 1987   KNEE ARTHROSCOPY  . NASAL TURBINATE REDUCTION Left 08/26/2015   Procedure: LEFT PARTIAL TURBINATE REDUCTION/SUBMUCOSAL RESECTION;  Surgeon: Vernie Murders, MD;  Location: [facility];  Service: ENT;  Laterality: Left;  diabetic - oral meds  . SEPTOPLASTY N/A  08/26/2015   Procedure: SEPTOPLASTY;  Surgeon: Vernie Murders, MD;  Location: [facility];  Service: ENT;  Laterality: N/A;  . SEPTOPLASTY Bilateral 12/02/2015   Procedure: SEPTOPLASTY REVISION;  Surgeon: Vernie Murders, MD;  Location: [facility];  Service: ENT;  Laterality: Bilateral;  DIABETIC    There were no vitals filed for this visit.  Subjective Assessment - 09/13/18 0840    Subjective   Dr Jaci Lazier text me and said that I am on that meds -and not following up with me until March - I am frustrated  - can I get shots in my wrists - doing okay with using my hands - but still stiff and tight - tender if touching at side of wrist- my elbow is much better - using them - did throw some ball  and shoot basketball with my son and  had initially quick pain but just once and then did not do it again     Patient Stated Goals  Want to get the pain and mobility in my hands and forearms better so I can do my job, lift and carry objcect, play golf      Currently in Pain?  No/denies    Pain Score  0-No pain   7 - with palpation on FCU    Pain Location  Wrist    Pain Orientation  Left;Right    Pain Descriptors /  Indicators  Tender    Pain Type  Acute pain    Pain Onset  More than a month ago    Aggravating Factors   palpation and end range - swing golf club          [facility] OT Assessment - 09/13/18 0001      AROM   Right Wrist Extension  63 Degrees    Right Wrist Flexion  63 Degrees    Right Wrist Radial Deviation  20 Degrees    Right Wrist Ulnar Deviation  28 Degrees    Left Wrist Extension  58 Degrees    Left Wrist Flexion  75 Degrees    Left Wrist Radial Deviation  20 Degrees    Left Wrist Ulnar Deviation  28 Degrees      Strength   Right Hand Grip (lbs)  57   extended arm    Left Hand Grip (lbs)  60   60 extended arm       assess grip in bilateral hands - improved in bilateral hands  And then wrist AROM in all planes - about the same - and tight end range  No pain but pull   But still tender over FCU on bilateral wrist with palpation - 7/10  Also 7/10 at ulnar wrist pain with swinging golf club - Strength in wrist in all planes - 5/5    Pt had blister with scab on dorsal forearm this date - about thumb nail side - pt report he did not feel heating pad on arm got to hot last week -and when he got to work - was little blister and then got scab- but doing okay per pt and no issues with it - and that he is a diabetic and heals slower        skin check done prior to ionto and pt to keep on hour afterwards - pt has no issues   OT Treatments/Exercises (OP) - 09/13/18 0001      Iontophoresis   Type of Iontophoresis  Dexamethasone    Location  ulnar wrist R and L     Dose  med patch , 2.0 current    Time  19      RUE Paraffin   Number Minutes Paraffin  10 Minutes    RUE Paraffin Location  Wrist   hand   Comments  prior to soft tissue and ROM       LUE Paraffin   Number Minutes Paraffin  10 Minutes    LUE Paraffin Location  Wrist;Hand    Comments  prior to soft tissue and ROM        soft tissue mobsagainusing Graston tool nr 2 and 4 for sweeping and brushing over volar and dorsal forearm and wrist- Carpal and MC spreads done webspace of thumb  End range for wrist extention - tight on L more than the R  And bilateral wrist flexion end range tight         OT Education - 09/13/18 1047    Education Details  findings and progress - HEP     Person(s) Educated  Patient    Methods  Explanation;Demonstration;Handout    Comprehension  Verbalized understanding;Returned demonstration       OT Short Term Goals - 08/14/18 1706      OT SHORT TERM GOAL #1   Title  Pain on PRWHE improve with more than 20 points     Baseline  at eval PRWHE pain score 42/50  and now 18/50    Status  Achieved      OT SHORT TERM GOAL #2   Title  Pt to be ind in HEP for splint wearing , soft tissue mobs and AROM to decrease pain and increase AROM at bilateral wrist      Status  Achieved        OT Long Term Goals - 08/14/18 1708      OT LONG TERM GOAL #1   Title  Bilateral wrist AROM to be WFL to use hand in more than 50% of functional act on PRWHE without increase symptoms     Baseline  Wrist AROM increase greatly but still decrease on the R more than L - function improve to 22%     Time  3    Period  Weeks    Status  On-going    Target Date  09/11/18      OT LONG TERM GOAL #2   Title  Strength in bilateral wrist improve to 4+/5 without increase symptoms to use hand in gripping , lifting , pushing door     Baseline  pain with UD , sup and wrist flexion - 2-4/10  increase to 5'10 at the worse     Time  6    Status  On-going    Target Date  09/25/18      OT LONG TERM GOAL #3   Title  Pt's bilateral  grip and 3 point grip improve to in range for his age to return to prior level of function    Baseline  grip R 55, L 31lbs , and Now R 63 and L  55 lbs     Time  6    Period  Weeks    Status  On-going    Target Date  09/25/18      OT LONG TERM GOAL #4   Title  Function score on PRWHE improve wiht more than 20 points     Baseline  at eval function score on PRWHE 31/50  and now 11/50    Status  Achieved            Plan - 09/13/18 1048    Clinical Impression Statement  Pt seen for 10 sessions - done 6 sessions of ionto to FCU - pt arrive this date with reports of no pain with using hands in very day tasks - but not doing any sports yet - stil tender at Ulnar wrist with palpation 7/10 by OT and when attempted to swing golf club - pt's wrist AROM still impaired in flexion and extention ( tight) - but pt also tight in elbows and other joints- including digits - pt is on Plaquenil since end of Dec - pt improve greatly since beeing seen but at stand still at the moment in ROM - grip  did improve- MRI showed tenosynovitis - pt had in past shots for his knees by Dr Dola FactorKubunski - told pt to contact Dr Landry MellowKubinski  and Dr Renard MatterBock to see if he can get appt - pt to  follow up with me again in 10 days     Occupational performance deficits (Please refer to evaluation for details):  ADL's;IADL's;Social Participation;Work;Play;Leisure    Rehab Potential  Good    OT Frequency  Biweekly    OT Duration  4 weeks    OT Treatment/Interventions  Self-care/ADL training;Therapeutic exercise;Iontophoresis;Paraffin;Fluidtherapy;Contrast Bath;Ultrasound;Manual Therapy;Passive range of motion;Splinting;Patient/family education    Plan  assess progress doing HEP for 2  wks - repeat PRWHE    Clinical Decision Making  Multiple treatment options, significant modification of task necessary    OT Home Exercise Plan  see pt instruction     Consulted and Agree with Plan of Care  Patient       Patient will benefit from skilled therapeutic intervention in order to improve the following deficits and impairments:  Impaired flexibility, Pain, Impaired sensation, Decreased range of motion, Decreased strength, Impaired UE functional use  Visit Diagnosis: Pain in left wrist  Pain in right wrist  Stiffness of left hand, not elsewhere classified  Stiffness of right wrist, not elsewhere classified  Stiffness of left wrist, not elsewhere classified  Muscle weakness (generalized)  Stiffness of right hand, not elsewhere classified    Problem List There are no active problems to display for this patient.   Oletta CohnuPreez, Richard Ritchey OTR/L,CLT 09/13/2018, 2:59 PM   [facility] [address] [place], [place], [address] Phone: [phone]   Fax:  [phone]  Name: Samuel JesterJeffrey Sterba MRN: [id number] Date of Birth: 01/06/1966

## 2018-09-13 NOTE — Patient Instructions (Signed)
Pt to get paraffin bath to use at home to work on range of motion with PROM to flexion and extention of bilateral wrists Contact Dr Renard Matter and if want to Dr Landry Mellow - had shots in his knees in the summer

## 2018-09-23 ENCOUNTER — Ambulatory Visit: Payer: BC Managed Care – PPO | Admitting: Occupational Therapy

## 2018-09-23 DIAGNOSIS — M25642 Stiffness of left hand, not elsewhere classified: Secondary | ICD-10-CM

## 2018-09-23 DIAGNOSIS — M6281 Muscle weakness (generalized): Secondary | ICD-10-CM

## 2018-09-23 DIAGNOSIS — M25532 Pain in left wrist: Secondary | ICD-10-CM | POA: Diagnosis not present

## 2018-09-23 DIAGNOSIS — M25632 Stiffness of left wrist, not elsewhere classified: Secondary | ICD-10-CM

## 2018-09-23 DIAGNOSIS — M25631 Stiffness of right wrist, not elsewhere classified: Secondary | ICD-10-CM

## 2018-09-23 DIAGNOSIS — M25641 Stiffness of right hand, not elsewhere classified: Secondary | ICD-10-CM

## 2018-09-23 DIAGNOSIS — M25531 Pain in right wrist: Secondary | ICD-10-CM

## 2018-09-23 NOTE — Therapy (Signed)
[facility] [address] [place], [address] Phone: [phone]   Fax:  [phone]  Occupational Therapy Treatment  Patient Details  Name: Keith Reilly MRN: [id number] Date of Birth: 03-11-1966 Referring Provider (OT): Rudene Christians   Encounter Date: 09/23/2018  OT End of Session - 09/23/18 0802    Visit Number  11    Number of Visits  11    Date for OT Re-Evaluation  09/25/18    OT Start Time  0730    OT Stop Time  0758    OT Time Calculation (min)  28 min    Activity Tolerance  Patient tolerated treatment well    Behavior During Therapy  [facility] for tasks assessed/performed       Past Medical History:  Diagnosis Date  . Anxiety   . BPH (benign prostatic hypertrophy)   . Bronchitis, acute 08/08/15   completed zithromax.  feeling better  . Deviated nasal septum    nasal obstruction,s/p nasal fx yrs ago  . Diabetes mellitus without complication ([place])    TYPE 2  . History of kidney stones   . Hyperlipemia   . Hypertension   . Insomnia   . Nasal turbinate hypertrophy   . Nephrolithiasis    KIDNEY STONES  . Scoliosis   . Seasonal allergies    sinus type symptoms  . Stroke [facility]) 2017   chronic arterial ischemic stroke. tia identified months after incident    Past Surgical History:  Procedure Laterality Date  . HERNIA REPAIR Right 1972   inguinal  . KNEE ARTHROSCOPY WITH MEDIAL MENISECTOMY Right 03/08/2018   Procedure: KNEE ARTHROSCOPY WITH PLICA EXCISION,SYNOVECTOMY CHONDROPLASTY, PARTIAL LATERAL MEDIAL MENISECTOMY;  Surgeon: Leim Fabry, MD;  Location: [facility];  Service: Orthopedics;  Laterality: Right;  . KNEE SURGERY Right 1987   KNEE ARTHROSCOPY  . NASAL TURBINATE REDUCTION Left 08/26/2015   Procedure: LEFT PARTIAL TURBINATE REDUCTION/SUBMUCOSAL RESECTION;  Surgeon: Margaretha Sheffield, MD;  Location: [place];  Service: ENT;  Laterality: Left;  diabetic - oral meds  . SEPTOPLASTY N/A  08/26/2015   Procedure: SEPTOPLASTY;  Surgeon: Margaretha Sheffield, MD;  Location: [place];  Service: ENT;  Laterality: N/A;  . SEPTOPLASTY Bilateral 12/02/2015   Procedure: SEPTOPLASTY REVISION;  Surgeon: Margaretha Sheffield, MD;  Location: [place];  Service: ENT;  Laterality: Bilateral;  DIABETIC    There were no vitals filed for this visit.  Subjective Assessment - 09/23/18 0736    Subjective   I had shot by Dr Candelaria Stagers - he told me to hold off on any exercises until I see you, I did not do anything - my wife did some massaging 3 x this past week     Patient Stated Goals  Want to get the pain and mobility in my hands and forearms better so I can do my job, lift and carry objcect, play golf      Currently in Pain?  --   no pain at rest - 5/10 with golf swing full swing        [facility] OT Assessment - 09/23/18 0001      AROM   Right Forearm Supination  65 Degrees    Left Forearm Supination  75 Degrees    Right Wrist Extension  63 Degrees    Right Wrist Flexion  68 Degrees    Right Wrist Radial Deviation  20 Degrees    Right Wrist Ulnar Deviation  28 Degrees  Left Wrist Extension  58 Degrees    Left Wrist Flexion  75 Degrees    Left Wrist Radial Deviation  20 Degrees    Left Wrist Ulnar Deviation  28 Degrees      Strength   Right Hand Grip (lbs)  64   extended arm 64   Left Hand Grip (lbs)  60   extended 65       Pt had last week  Ultrasound guided LEFT and Right  radiocarpal joint Injections by Dr Candelaria Stagers - did not do exercises or force movement - kept pain free range of motion  Wife still do soft tissue mobs 3 x wk on forearms  AROM and grip strength assess   pt cont to have about same AROM in wrist  tight end  Range for flexion , extention and supination - also tight in elbows and fingers  Grip did increase bilateral -greater progress in L than R -  Pain little better with swing from 7 to 5/10 - and then with Neoprene splints 4/10 Pt to start putting ,  chipping and 1/2 swing that is pain free  and then maintain his ROM - and follow up with Dr Meda Coffee - or shots  And contact me if need or want to be seen again  Cont to do ROM , soft tissue and pain free  use -                OT Education - 09/23/18 0802    Education Details  findings and progress - HEP     Person(s) Educated  Patient    Methods  Explanation;Demonstration    Comprehension  Verbalized understanding;Returned demonstration       OT Short Term Goals - 09/23/18 0807      OT SHORT TERM GOAL #1   Title  Pain on PRWHE improve with more than 20 points     Baseline  at eval PRWHE pain score 42/50 and 12/27 was  18/50  -and now 14/50    Status  Achieved      OT [place] #2   Title  Pt to be ind in HEP for splint wearing , soft tissue mobs and AROM to decrease pain and increase AROM at bilateral wrist     Baseline  pain did decrease - now with end range swing of golf club only and resistance end range UD and Flexion     Status  Achieved        OT Long Term Goals - 09/23/18 7989      OT LONG TERM GOAL #1   Title  Bilateral wrist AROM to be WFL to use hand in more than 50% of functional act on PRWHE without increase symptoms     Baseline  Wrist AROM increase greatly but still decrease on the R more than L -    Status  Achieved      OT LONG TERM GOAL #2   Title  Strength in bilateral wrist improve to 4+/5 without increase symptoms to use hand in gripping , lifting , pushing door     Baseline  strength 5/5 - pain resistance UD and flexion     Status  Partially Met      OT LONG TERM GOAL #3   Title  Pt's bilateral  grip and 3 point grip improve to in range for his age to return to prior level of function    Baseline  Grip on R 60 ,  L 64 lbs - decrease for age range     Status  Not Met      OT LONG TERM GOAL #4   Title  Function score on PRWHE improve wiht more than 20 points     Baseline  at eval function score on PRWHE 31/50  and now 11/50    Status   Achieved            Plan - 09/23/18 0803    Clinical Impression Statement  Pt was seen 11 sesssions - and done ionto for 6 sessions - had shot last week in wrist by Dr Candelaria Stagers - and on [place] since end of Dec - pt made great progress from [facility] in AROM , grip strength, pain in daily act  ROM - but pt cont to have pain on ulnar side of wrist with full golf swing , palpation , and resistance end range for wrist UD and flexion - pt to cont with HEP at this time and contact me if needed     Plan  pt to contact me if needed - cont with ROM , splint wearing - wants to look into further evaluation of wrist pain and joint stiffness     Clinical Decision Making  Limited treatment options, no task modification necessary    OT Home Exercise Plan  see pt instruction     Consulted and Agree with Plan of Care  Patient       Patient will benefit from skilled therapeutic intervention in order to improve the following deficits and impairments:     Visit Diagnosis: Pain in left wrist  Pain in right wrist  Stiffness of left hand, not elsewhere classified  Stiffness of right wrist, not elsewhere classified  Stiffness of left wrist, not elsewhere classified  Muscle weakness (generalized)  Stiffness of right hand, not elsewhere classified    Problem List There are no active problems to display for this patient.   Rosalyn Gess OTR/L,CLT 09/23/2018, 8:45 AM  [place] [facility] [address] [place], [address] Phone: [phone]   Fax:  [phone]  Name: Keith Reilly MRN: [id number] Date of Birth: Nov 27, 1965

## 2018-09-23 NOTE — Patient Instructions (Signed)
Cont with AROM and PROM for wrist in all planes -  Keep pain free range during using   can do golf putting, chipping , and 1/2 swing - if pain free- and use wrist neoprene splints as needed for act to help for pain

## 2019-09-15 ENCOUNTER — Other Ambulatory Visit: Payer: BC Managed Care – PPO

## 2020-02-18 IMAGING — MR MR WRIST*L* W/O CM
4 of 7 series · 23 of 40 positions shown · non-contrast
Comparison: None.

CLINICAL DATA: Bilateral wrist pain.  Numbness in the hands.

EXAM:
MR OF THE LEFT WRIST WITHOUT CONTRAST
TECHNIQUE: Multiplanar, multisequence MR imaging of the left wrist was
performed. No intravenous contrast was administered.

[Series 4: T2 fat-sat · axial · 3.0mm · 0.21mm/px · z∈[-24,+58]mm · 7 of 28 slices shown (1 of 2)]
[im 1/28]
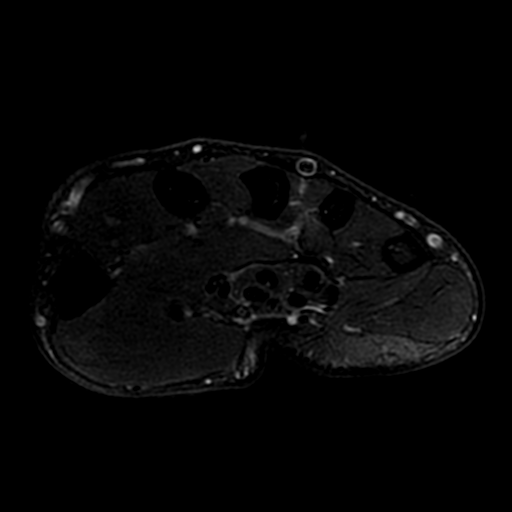
[im 5/28]
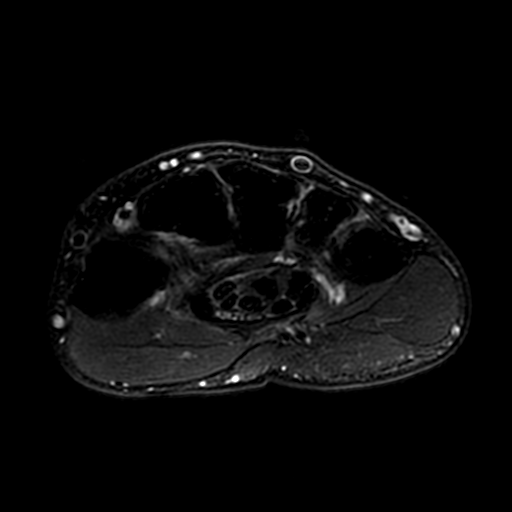
[im 10/28]
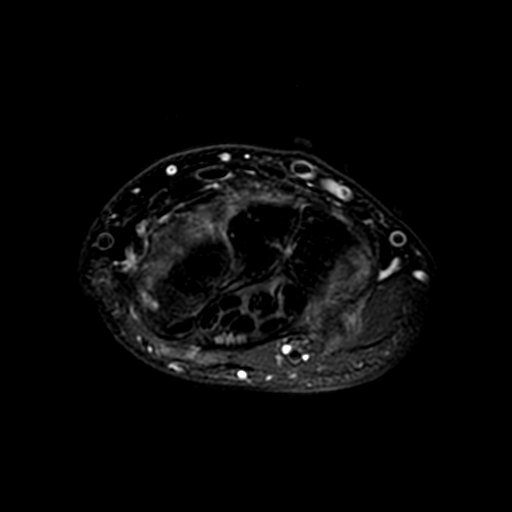
[im 14/28]
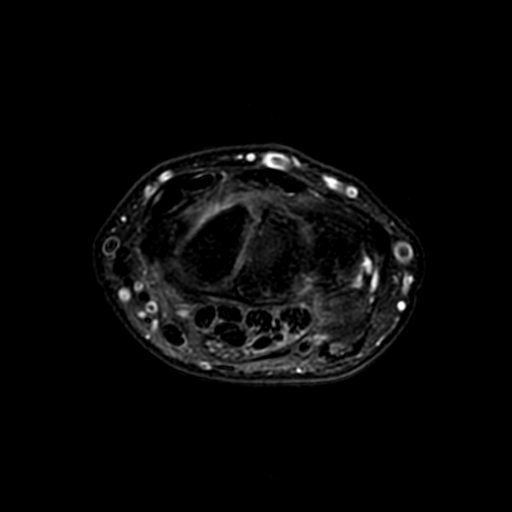
[im 19/28]
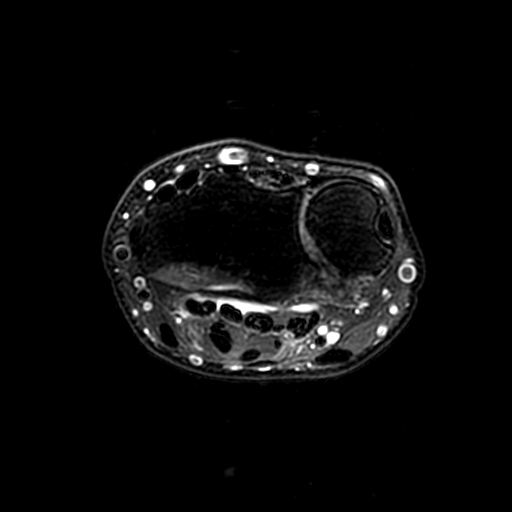
[im 23/28]
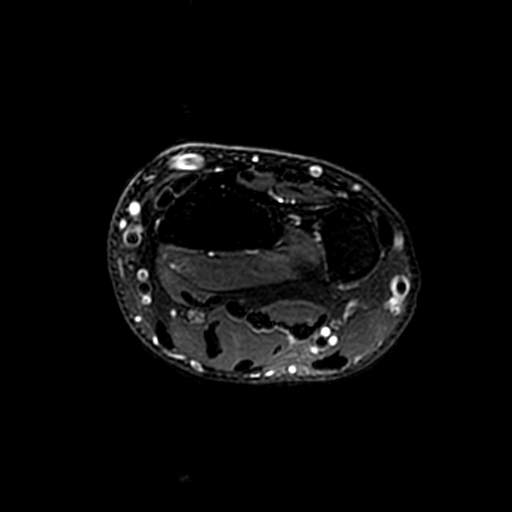
[im 28/28]
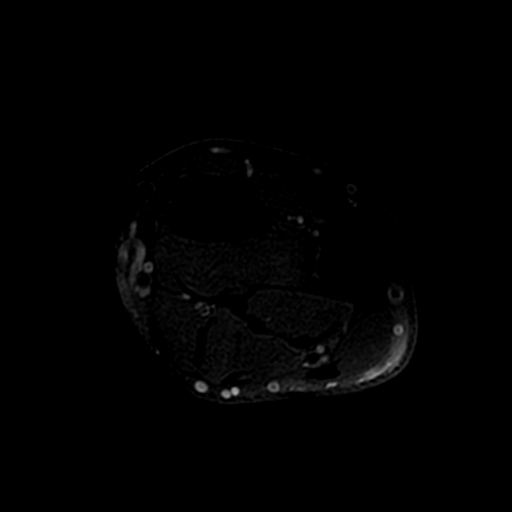

[Series 7: T2 fat-sat · coronal · 3.0mm · 0.21mm/px · 5 of 18 slices shown (2 of 2)]
[im 1/18]
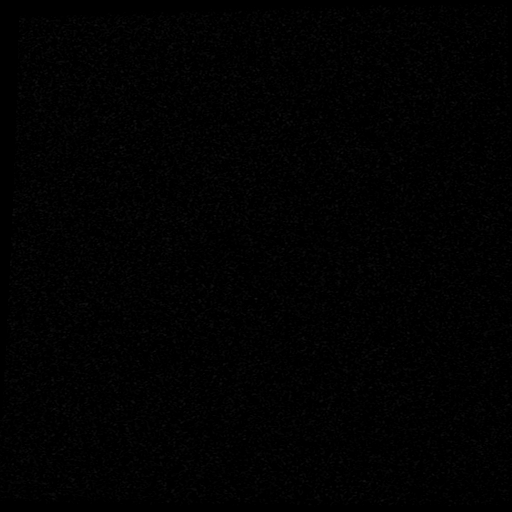
[im 5/18]
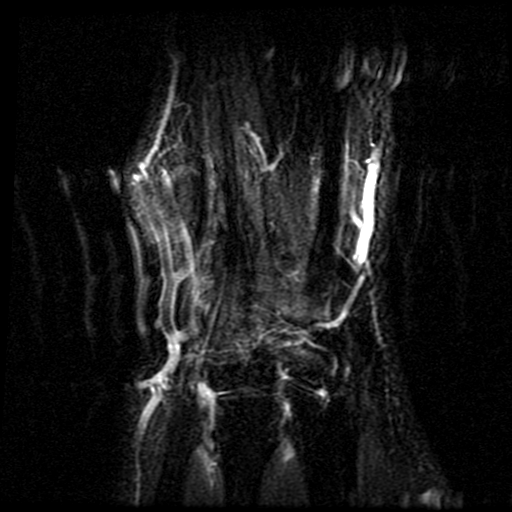
[im 9/18]
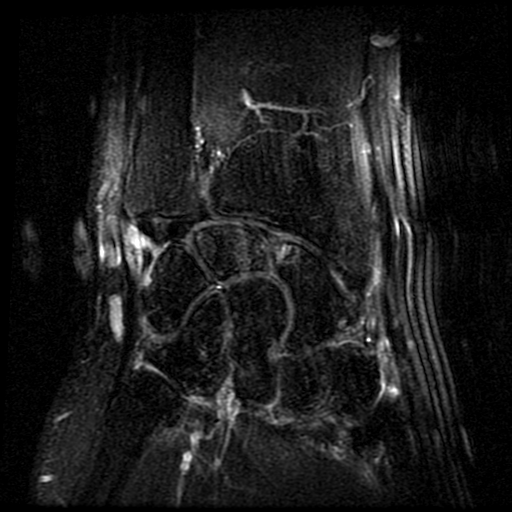
[im 13/18]
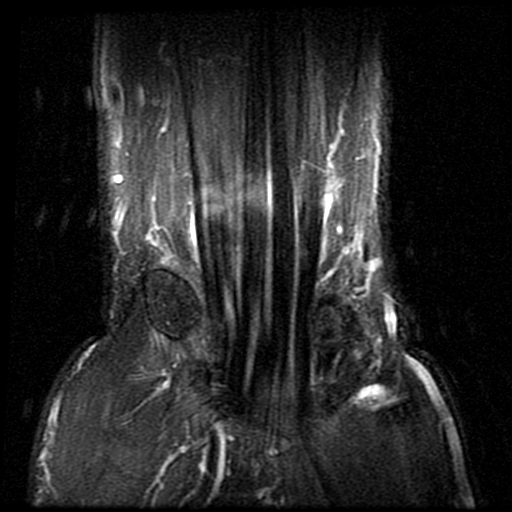
[im 18/18]
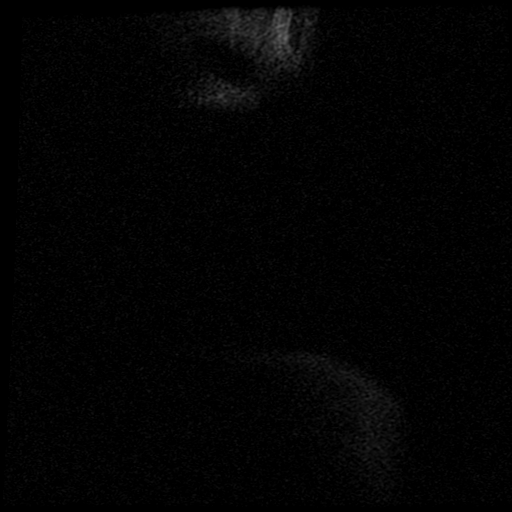

[Series 8: PD fat-sat · coronal · 3.0mm · 0.21mm/px · 5 of 18 slices shown (1 of 2)]
[im 1/18]
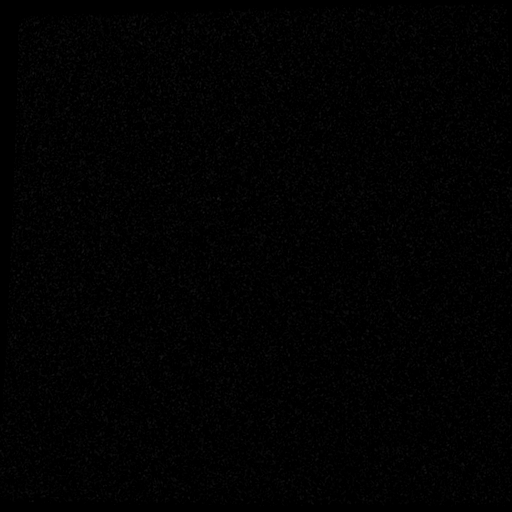
[im 5/18]
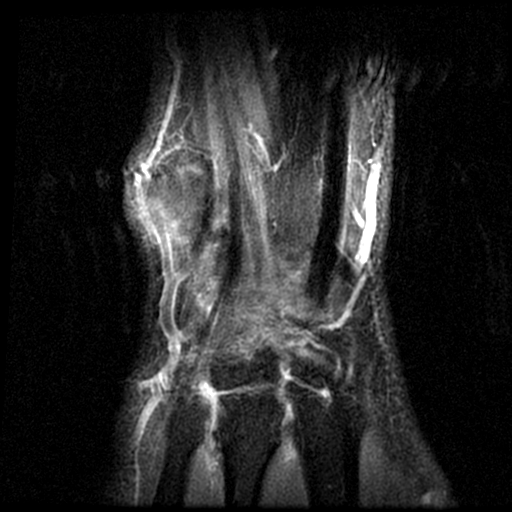
[im 9/18]
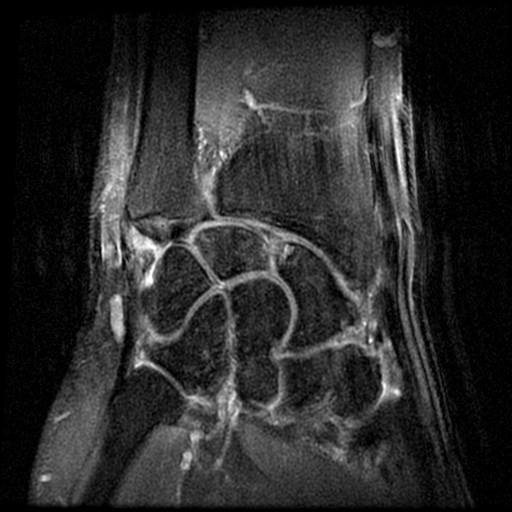
[im 13/18]
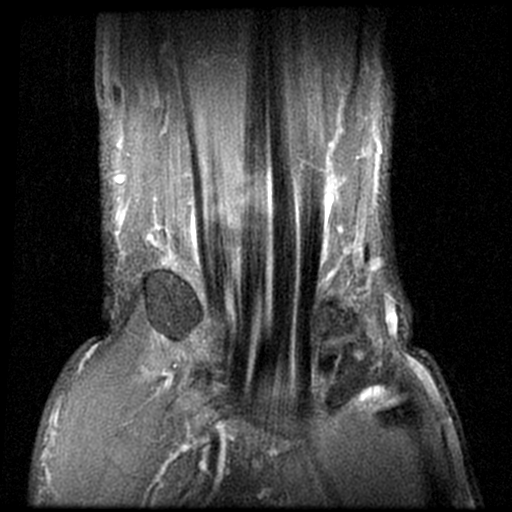
[im 18/18]
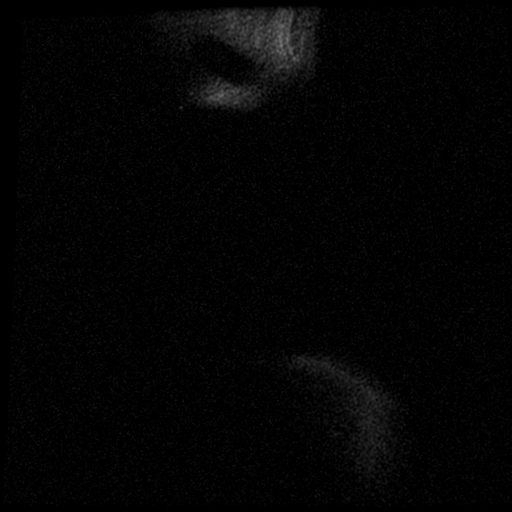

[Series 10: PD fat-sat · sagittal · 3.0mm · 0.43mm/px · 6 of 24 slices shown (2 of 2)]
[im 1/24]
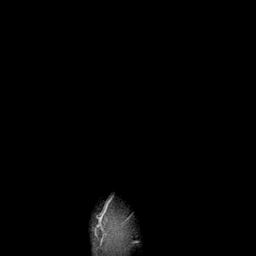
[im 5/24]
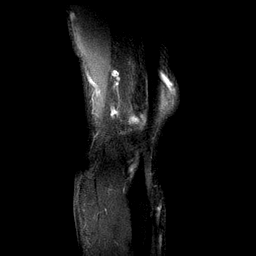
[im 10/24]
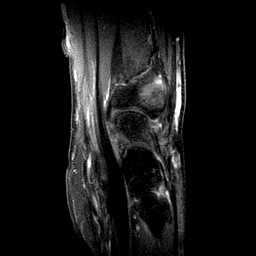
[im 14/24]
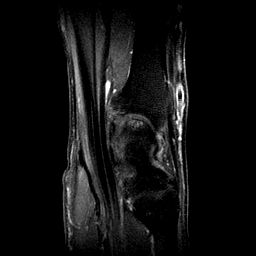
[im 19/24]
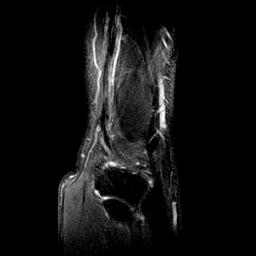
[im 24/24]
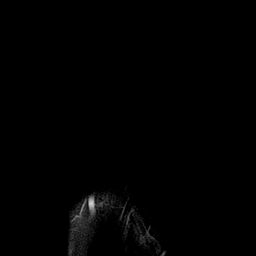

[23 of 40 positions shown; findings below may reference images not displayed]

FINDINGS: Ligaments: Mild degeneration of the scapholunate ligament with a
tear of the dorsal band. Intact central and volar components of the
scapholunate ligament. Intact lunotriquetral ligament.

Triangular fibrocartilage: Thinning of the TFCC without a discrete
tear.

Tendons: Intact extensor compartment tendons. Intact flexor
compartment tendons. Small amount of tenosynovial fluid involving
the flexor digitorum tendons just proximal to the carpal tunnel.

Carpal tunnel/median nerve: Normal carpal tunnel. Normal median
nerve.

Guyon's canal: Normal. Ulnar nerve is normal. No solid or cystic
masses along the course of the ulnar nerve.

Joint/cartilage: Partial-thickness cartilage loss of the first CMC
joint and scaphotrapeziotrapezoid joint. Partial-thickness cartilage
loss of the distal radioulnar joint. Diffuse carpal synovitis and
synovitis of the distal radioulnar joint.

Bones/carpal alignment: No acute osseous abnormality. No aggressive
osseous lesion. Subchondral cystic changes in the proximal pole of
the scaphoid at the scapholunate ligament insertion. No aggressive
osseous lesion. No bone destruction.

Other: No fluid collection or hematoma.
IMPRESSION: 1. Diffuse carpal synovitis and synovitis of the distal radioulnar
joint. This appearance can be seen with an inflammatory arthropathy.
Correlate with laboratory values.
2. Mild degeneration of the scapholunate ligament with a tear of the
dorsal band. Intact central and volar components of the scapholunate
ligament.
